# Patient Record
Sex: Female | Born: 1935 | Race: White | Hispanic: No | Smoking: Former smoker
Health system: Southern US, Community
[De-identification: ages and names within clinical notes are randomized; demographics above are authoritative.]

## PROBLEM LIST (undated history)

## (undated) DIAGNOSIS — G459 Transient cerebral ischemic attack, unspecified: Secondary | ICD-10-CM

## (undated) DIAGNOSIS — E785 Hyperlipidemia, unspecified: Secondary | ICD-10-CM

## (undated) DIAGNOSIS — N184 Chronic kidney disease, stage 4 (severe): Secondary | ICD-10-CM

## (undated) DIAGNOSIS — Z9889 Other specified postprocedural states: Secondary | ICD-10-CM

## (undated) DIAGNOSIS — K635 Polyp of colon: Secondary | ICD-10-CM

## (undated) DIAGNOSIS — F0390 Unspecified dementia without behavioral disturbance: Secondary | ICD-10-CM

## (undated) DIAGNOSIS — R001 Bradycardia, unspecified: Secondary | ICD-10-CM

## (undated) DIAGNOSIS — I350 Nonrheumatic aortic (valve) stenosis: Secondary | ICD-10-CM

## (undated) DIAGNOSIS — I10 Essential (primary) hypertension: Secondary | ICD-10-CM

## (undated) DIAGNOSIS — C189 Malignant neoplasm of colon, unspecified: Secondary | ICD-10-CM

## (undated) DIAGNOSIS — Z950 Presence of cardiac pacemaker: Secondary | ICD-10-CM

## (undated) DIAGNOSIS — Z8719 Personal history of other diseases of the digestive system: Secondary | ICD-10-CM

## (undated) HISTORY — DX: Bradycardia, unspecified: R00.1

## (undated) HISTORY — PX: ABDOMINAL HYSTERECTOMY: SHX81

## (undated) HISTORY — DX: Polyp of colon: K63.5

## (undated) HISTORY — PX: COLON RESECTION: SHX5231

## (undated) HISTORY — DX: Malignant neoplasm of colon, unspecified: C18.9

## (undated) HISTORY — DX: Nonrheumatic aortic (valve) stenosis: I35.0

## (undated) HISTORY — DX: Hyperlipidemia, unspecified: E78.5

---

## 1997-09-02 ENCOUNTER — Ambulatory Visit (HOSPITAL_COMMUNITY): Admission: RE | Admit: 1997-09-02 | Discharge: 1997-09-02 | Payer: Self-pay | Admitting: *Deleted

## 1997-10-03 ENCOUNTER — Encounter: Payer: Self-pay | Admitting: Gastroenterology

## 1998-11-10 ENCOUNTER — Ambulatory Visit (HOSPITAL_COMMUNITY): Admission: RE | Admit: 1998-11-10 | Discharge: 1998-11-10 | Payer: Self-pay | Admitting: Internal Medicine

## 1999-01-28 ENCOUNTER — Other Ambulatory Visit: Admission: RE | Admit: 1999-01-28 | Discharge: 1999-01-28 | Payer: Self-pay | Admitting: Gastroenterology

## 1999-01-28 ENCOUNTER — Encounter (INDEPENDENT_AMBULATORY_CARE_PROVIDER_SITE_OTHER): Payer: Self-pay | Admitting: Specialist

## 1999-02-15 ENCOUNTER — Ambulatory Visit (HOSPITAL_COMMUNITY): Admission: RE | Admit: 1999-02-15 | Discharge: 1999-02-15 | Payer: Self-pay | Admitting: Gastroenterology

## 1999-02-15 ENCOUNTER — Encounter: Payer: Self-pay | Admitting: Gastroenterology

## 2000-06-18 ENCOUNTER — Inpatient Hospital Stay (HOSPITAL_COMMUNITY): Admission: EM | Admit: 2000-06-18 | Discharge: 2000-06-29 | Payer: Self-pay | Admitting: *Deleted

## 2000-06-21 ENCOUNTER — Encounter: Payer: Self-pay | Admitting: Family Medicine

## 2000-06-23 ENCOUNTER — Encounter: Payer: Self-pay | Admitting: Family Medicine

## 2000-06-26 ENCOUNTER — Encounter: Payer: Self-pay | Admitting: Family Medicine

## 2000-06-28 ENCOUNTER — Encounter: Payer: Self-pay | Admitting: Cardiovascular Disease

## 2000-07-04 ENCOUNTER — Encounter: Admission: RE | Admit: 2000-07-04 | Discharge: 2000-07-04 | Payer: Self-pay | Admitting: Family Medicine

## 2000-08-30 ENCOUNTER — Encounter: Admission: RE | Admit: 2000-08-30 | Discharge: 2000-08-30 | Payer: Self-pay | Admitting: Family Medicine

## 2000-12-15 ENCOUNTER — Encounter: Admission: RE | Admit: 2000-12-15 | Discharge: 2000-12-15 | Payer: Self-pay | Admitting: Family Medicine

## 2000-12-28 ENCOUNTER — Ambulatory Visit (HOSPITAL_COMMUNITY): Admission: RE | Admit: 2000-12-28 | Discharge: 2000-12-28 | Payer: Self-pay | Admitting: Family Medicine

## 2001-01-10 ENCOUNTER — Encounter: Admission: RE | Admit: 2001-01-10 | Discharge: 2001-01-10 | Payer: Self-pay | Admitting: Family Medicine

## 2001-01-24 ENCOUNTER — Encounter: Admission: RE | Admit: 2001-01-24 | Discharge: 2001-01-24 | Payer: Self-pay | Admitting: Family Medicine

## 2001-04-13 ENCOUNTER — Encounter: Admission: RE | Admit: 2001-04-13 | Discharge: 2001-04-13 | Payer: Self-pay | Admitting: Family Medicine

## 2003-08-21 ENCOUNTER — Encounter: Admission: RE | Admit: 2003-08-21 | Discharge: 2003-08-21 | Payer: Self-pay | Admitting: Internal Medicine

## 2003-08-28 ENCOUNTER — Encounter: Admission: RE | Admit: 2003-08-28 | Discharge: 2003-08-28 | Payer: Self-pay | Admitting: Internal Medicine

## 2003-10-17 ENCOUNTER — Encounter: Admission: RE | Admit: 2003-10-17 | Discharge: 2003-10-17 | Payer: Self-pay | Admitting: Internal Medicine

## 2004-08-20 ENCOUNTER — Encounter: Admission: RE | Admit: 2004-08-20 | Discharge: 2004-08-20 | Payer: Self-pay | Admitting: Internal Medicine

## 2004-10-27 ENCOUNTER — Ambulatory Visit: Payer: Self-pay | Admitting: Gastroenterology

## 2005-01-17 ENCOUNTER — Ambulatory Visit: Payer: Self-pay | Admitting: Gastroenterology

## 2005-01-28 ENCOUNTER — Ambulatory Visit: Payer: Self-pay | Admitting: Gastroenterology

## 2005-01-28 ENCOUNTER — Encounter (INDEPENDENT_AMBULATORY_CARE_PROVIDER_SITE_OTHER): Payer: Self-pay | Admitting: *Deleted

## 2005-05-30 HISTORY — PX: CHOLECYSTECTOMY: SHX55

## 2006-01-20 ENCOUNTER — Encounter: Admission: RE | Admit: 2006-01-20 | Discharge: 2006-01-20 | Payer: Self-pay | Admitting: Internal Medicine

## 2006-02-20 ENCOUNTER — Encounter: Admission: RE | Admit: 2006-02-20 | Discharge: 2006-02-20 | Payer: Self-pay | Admitting: Internal Medicine

## 2006-03-01 ENCOUNTER — Ambulatory Visit: Payer: Self-pay | Admitting: Gastroenterology

## 2006-03-15 ENCOUNTER — Ambulatory Visit: Payer: Self-pay | Admitting: Gastroenterology

## 2006-03-29 ENCOUNTER — Encounter (INDEPENDENT_AMBULATORY_CARE_PROVIDER_SITE_OTHER): Payer: Self-pay | Admitting: Specialist

## 2006-03-29 ENCOUNTER — Encounter: Payer: Self-pay | Admitting: Gastroenterology

## 2006-03-29 ENCOUNTER — Ambulatory Visit (HOSPITAL_COMMUNITY): Admission: RE | Admit: 2006-03-29 | Discharge: 2006-03-30 | Payer: Self-pay | Admitting: General Surgery

## 2006-05-30 HISTORY — PX: VENTRAL HERNIA REPAIR: SHX424

## 2006-12-19 ENCOUNTER — Inpatient Hospital Stay (HOSPITAL_COMMUNITY): Admission: RE | Admit: 2006-12-19 | Discharge: 2006-12-23 | Payer: Self-pay | Admitting: General Surgery

## 2007-08-28 ENCOUNTER — Ambulatory Visit: Payer: Self-pay | Admitting: Gastroenterology

## 2007-08-28 LAB — CONVERTED CEMR LAB: Lipase: 30 units/L (ref 11.0–59.0)

## 2007-09-03 ENCOUNTER — Ambulatory Visit: Payer: Self-pay | Admitting: Gastroenterology

## 2007-09-03 LAB — CONVERTED CEMR LAB
BUN: 23 mg/dL (ref 6–23)
Creatinine, Ser: 1.4 mg/dL — ABNORMAL HIGH (ref 0.4–1.2)

## 2007-09-10 ENCOUNTER — Ambulatory Visit: Payer: Self-pay | Admitting: Cardiology

## 2008-01-18 ENCOUNTER — Ambulatory Visit: Payer: Self-pay | Admitting: Gastroenterology

## 2008-01-25 ENCOUNTER — Encounter: Payer: Self-pay | Admitting: Gastroenterology

## 2008-02-06 ENCOUNTER — Ambulatory Visit: Payer: Self-pay | Admitting: Gastroenterology

## 2008-02-06 ENCOUNTER — Encounter: Payer: Self-pay | Admitting: Gastroenterology

## 2008-02-10 ENCOUNTER — Encounter: Payer: Self-pay | Admitting: Gastroenterology

## 2008-02-21 DIAGNOSIS — Z85038 Personal history of other malignant neoplasm of large intestine: Secondary | ICD-10-CM | POA: Insufficient documentation

## 2008-02-26 ENCOUNTER — Ambulatory Visit: Payer: Self-pay | Admitting: Gastroenterology

## 2008-02-26 DIAGNOSIS — R634 Abnormal weight loss: Secondary | ICD-10-CM

## 2008-02-26 DIAGNOSIS — R1013 Epigastric pain: Secondary | ICD-10-CM

## 2008-02-27 ENCOUNTER — Telehealth: Payer: Self-pay | Admitting: Gastroenterology

## 2008-02-27 ENCOUNTER — Encounter: Payer: Self-pay | Admitting: Gastroenterology

## 2008-02-27 ENCOUNTER — Ambulatory Visit: Payer: Self-pay | Admitting: Gastroenterology

## 2008-02-28 ENCOUNTER — Ambulatory Visit (HOSPITAL_COMMUNITY): Admission: RE | Admit: 2008-02-28 | Discharge: 2008-02-28 | Payer: Self-pay | Admitting: Gastroenterology

## 2008-02-28 ENCOUNTER — Encounter: Payer: Self-pay | Admitting: Gastroenterology

## 2008-02-29 ENCOUNTER — Telehealth: Payer: Self-pay | Admitting: Gastroenterology

## 2008-03-27 ENCOUNTER — Ambulatory Visit: Payer: Self-pay | Admitting: Gastroenterology

## 2008-03-27 DIAGNOSIS — K219 Gastro-esophageal reflux disease without esophagitis: Secondary | ICD-10-CM | POA: Insufficient documentation

## 2008-03-27 DIAGNOSIS — K2970 Gastritis, unspecified, without bleeding: Secondary | ICD-10-CM | POA: Insufficient documentation

## 2008-03-27 DIAGNOSIS — K2990 Gastroduodenitis, unspecified, without bleeding: Secondary | ICD-10-CM

## 2008-03-27 DIAGNOSIS — K3184 Gastroparesis: Secondary | ICD-10-CM | POA: Insufficient documentation

## 2008-04-03 ENCOUNTER — Telehealth (INDEPENDENT_AMBULATORY_CARE_PROVIDER_SITE_OTHER): Payer: Self-pay | Admitting: *Deleted

## 2008-09-01 ENCOUNTER — Ambulatory Visit: Payer: Self-pay | Admitting: Gastroenterology

## 2008-09-02 ENCOUNTER — Encounter: Payer: Self-pay | Admitting: Gastroenterology

## 2009-03-30 HISTORY — PX: BACK SURGERY: SHX140

## 2009-04-16 ENCOUNTER — Inpatient Hospital Stay (HOSPITAL_COMMUNITY): Admission: RE | Admit: 2009-04-16 | Discharge: 2009-04-19 | Payer: Self-pay | Admitting: Cardiovascular Disease

## 2009-04-16 DIAGNOSIS — Z950 Presence of cardiac pacemaker: Secondary | ICD-10-CM

## 2009-04-16 HISTORY — PX: PERMANENT PACEMAKER INSERTION: SHX6023

## 2009-04-16 HISTORY — DX: Presence of cardiac pacemaker: Z95.0

## 2010-06-20 ENCOUNTER — Encounter: Payer: Self-pay | Admitting: Gastroenterology

## 2010-09-01 LAB — COMPREHENSIVE METABOLIC PANEL
AST: 22 U/L (ref 0–37)
Albumin: 3.2 g/dL — ABNORMAL LOW (ref 3.5–5.2)
Alkaline Phosphatase: 33 U/L — ABNORMAL LOW (ref 39–117)
Alkaline Phosphatase: 37 U/L — ABNORMAL LOW (ref 39–117)
Alkaline Phosphatase: 39 U/L (ref 39–117)
BUN: 25 mg/dL — ABNORMAL HIGH (ref 6–23)
BUN: 30 mg/dL — ABNORMAL HIGH (ref 6–23)
BUN: 31 mg/dL — ABNORMAL HIGH (ref 6–23)
CO2: 22 mEq/L (ref 19–32)
CO2: 23 mEq/L (ref 19–32)
CO2: 24 mEq/L (ref 19–32)
Calcium: 8.6 mg/dL (ref 8.4–10.5)
Chloride: 109 mEq/L (ref 96–112)
Chloride: 110 mEq/L (ref 96–112)
Creatinine, Ser: 1.45 mg/dL — ABNORMAL HIGH (ref 0.4–1.2)
Creatinine, Ser: 1.52 mg/dL — ABNORMAL HIGH (ref 0.4–1.2)
GFR calc non Af Amer: 29 mL/min — ABNORMAL LOW (ref >60)
GFR calc non Af Amer: 33 mL/min — ABNORMAL LOW (ref >60)
GFR calc non Af Amer: 35 mL/min — ABNORMAL LOW (ref >60)
Glucose, Bld: 104 mg/dL — ABNORMAL HIGH (ref 70–99)
Glucose, Bld: 122 mg/dL — ABNORMAL HIGH (ref 70–99)
Potassium: 3.9 mEq/L (ref 3.5–5.1)
Potassium: 4 mEq/L (ref 3.5–5.1)
Potassium: 4.1 mEq/L (ref 3.5–5.1)
Total Bilirubin: 0.5 mg/dL (ref 0.3–1.2)
Total Bilirubin: 0.5 mg/dL (ref 0.3–1.2)
Total Protein: 5.9 g/dL — ABNORMAL LOW (ref 6.0–8.3)

## 2010-09-01 LAB — CBC
HCT: 36.3 % (ref 36.0–46.0)
HCT: 36.9 % (ref 36.0–46.0)
HCT: 36.9 % (ref 36.0–46.0)
Hemoglobin: 12.3 g/dL (ref 12.0–15.0)
Hemoglobin: 12.4 g/dL (ref 12.0–15.0)
MCHC: 33.8 g/dL (ref 30.0–36.0)
MCV: 86.1 fL (ref 78.0–100.0)
MCV: 86.5 fL (ref 78.0–100.0)
Platelets: 217 10*3/uL (ref 150–400)
RBC: 4.26 MIL/uL (ref 3.87–5.11)
RDW: 13.7 % (ref 11.5–15.5)
WBC: 8.8 10*3/uL (ref 4.0–10.5)
WBC: 9.7 10*3/uL (ref 4.0–10.5)

## 2010-09-01 LAB — CARDIAC PANEL(CRET KIN+CKTOT+MB+TROPI)
CK, MB: 1.2 ng/mL (ref 0.3–4.0)
CK, MB: 1.6 ng/mL (ref 0.3–4.0)
Relative Index: INVALID (ref 0.0–2.5)
Total CK: 28 U/L (ref 7–177)
Troponin I: 0.15 ng/mL — ABNORMAL HIGH (ref 0.00–0.06)
Troponin I: 0.17 ng/mL — ABNORMAL HIGH (ref 0.00–0.06)
Troponin I: 0.41 ng/mL — ABNORMAL HIGH (ref 0.00–0.06)

## 2010-09-01 LAB — LIPID PANEL
LDL Cholesterol: 101 mg/dL — ABNORMAL HIGH (ref 0–99)
Triglycerides: 140 mg/dL (ref <150)

## 2010-09-01 LAB — URINE CULTURE

## 2010-09-29 ENCOUNTER — Emergency Department (HOSPITAL_COMMUNITY): Payer: Medicare Other

## 2010-09-29 ENCOUNTER — Inpatient Hospital Stay (HOSPITAL_COMMUNITY)
Admission: EM | Admit: 2010-09-29 | Discharge: 2010-10-05 | DRG: 149 | Disposition: A | Discharge status: Home / Self Care | Payer: Medicare Other | Source: Ambulatory Visit | Attending: Family Medicine | Admitting: Family Medicine

## 2010-09-29 DIAGNOSIS — N189 Chronic kidney disease, unspecified: Secondary | ICD-10-CM | POA: Diagnosis present

## 2010-09-29 DIAGNOSIS — Z950 Presence of cardiac pacemaker: Secondary | ICD-10-CM

## 2010-09-29 DIAGNOSIS — K219 Gastro-esophageal reflux disease without esophagitis: Secondary | ICD-10-CM | POA: Diagnosis present

## 2010-09-29 DIAGNOSIS — Z79899 Other long term (current) drug therapy: Secondary | ICD-10-CM

## 2010-09-29 DIAGNOSIS — Z880 Allergy status to penicillin: Secondary | ICD-10-CM

## 2010-09-29 DIAGNOSIS — H8110 Benign paroxysmal vertigo, unspecified ear: Principal | ICD-10-CM | POA: Diagnosis present

## 2010-09-29 DIAGNOSIS — Z7982 Long term (current) use of aspirin: Secondary | ICD-10-CM

## 2010-09-29 DIAGNOSIS — F329 Major depressive disorder, single episode, unspecified: Secondary | ICD-10-CM | POA: Diagnosis present

## 2010-09-29 DIAGNOSIS — I129 Hypertensive chronic kidney disease with stage 1 through stage 4 chronic kidney disease, or unspecified chronic kidney disease: Secondary | ICD-10-CM | POA: Diagnosis present

## 2010-09-29 DIAGNOSIS — N390 Urinary tract infection, site not specified: Secondary | ICD-10-CM | POA: Diagnosis present

## 2010-09-29 DIAGNOSIS — N179 Acute kidney failure, unspecified: Secondary | ICD-10-CM | POA: Diagnosis present

## 2010-09-29 DIAGNOSIS — A498 Other bacterial infections of unspecified site: Secondary | ICD-10-CM | POA: Diagnosis present

## 2010-09-29 DIAGNOSIS — F3289 Other specified depressive episodes: Secondary | ICD-10-CM | POA: Diagnosis present

## 2010-09-29 DIAGNOSIS — E785 Hyperlipidemia, unspecified: Secondary | ICD-10-CM | POA: Diagnosis present

## 2010-09-29 DIAGNOSIS — F0390 Unspecified dementia without behavioral disturbance: Secondary | ICD-10-CM | POA: Diagnosis present

## 2010-09-29 HISTORY — DX: Unspecified dementia, unspecified severity, without behavioral disturbance, psychotic disturbance, mood disturbance, and anxiety: F03.90

## 2010-09-29 HISTORY — DX: Other specified postprocedural states: Z98.890

## 2010-09-29 HISTORY — DX: Personal history of other diseases of the digestive system: Z87.19

## 2010-09-29 LAB — CBC
HCT: 44 % (ref 36.0–46.0)
Hemoglobin: 14 g/dL (ref 12.0–15.0)
MCH: 27.9 pg (ref 26.0–34.0)
MCV: 87.6 fL (ref 78.0–100.0)
Platelets: 346 10*3/uL (ref 150–400)
RBC: 5.02 MIL/uL (ref 3.87–5.11)
WBC: 16.8 10*3/uL — ABNORMAL HIGH (ref 4.0–10.5)

## 2010-09-29 LAB — BASIC METABOLIC PANEL
Chloride: 104 mEq/L (ref 96–112)
GFR calc Af Amer: 29 mL/min — ABNORMAL LOW (ref >60)
GFR calc non Af Amer: 24 mL/min — ABNORMAL LOW (ref >60)
Potassium: 4.5 mEq/L (ref 3.5–5.1)
Sodium: 136 mEq/L (ref 135–145)

## 2010-09-29 LAB — URINALYSIS, ROUTINE W REFLEX MICROSCOPIC
Glucose, UA: NEGATIVE mg/dL
Ketones, ur: NEGATIVE mg/dL
Nitrite: NEGATIVE
Specific Gravity, Urine: 1.02 (ref 1.005–1.030)
pH: 5.5 (ref 5.0–8.0)

## 2010-09-29 LAB — DIFFERENTIAL
Eosinophils Absolute: 0.1 10*3/uL (ref 0.0–0.7)
Lymphocytes Relative: 10 % — ABNORMAL LOW (ref 12–46)
Lymphs Abs: 1.7 10*3/uL (ref 0.7–4.0)
Monocytes Relative: 4 % (ref 3–12)
Neutrophils Relative %: 84 % — ABNORMAL HIGH (ref 43–77)

## 2010-09-29 LAB — URINE MICROSCOPIC-ADD ON

## 2010-09-29 LAB — POCT CARDIAC MARKERS
CKMB, poc: <1 ng/mL — ABNORMAL LOW (ref 1.0–8.0)
Troponin i, poc: <0.05 ng/mL (ref 0.00–0.09)

## 2010-09-30 ENCOUNTER — Observation Stay (HOSPITAL_COMMUNITY): Payer: Medicare Other

## 2010-10-01 ENCOUNTER — Encounter (HOSPITAL_COMMUNITY): Payer: Self-pay | Admitting: Radiology

## 2010-10-01 ENCOUNTER — Observation Stay (HOSPITAL_COMMUNITY): Payer: Medicare Other

## 2010-10-01 LAB — CBC
Hemoglobin: 11.2 g/dL — ABNORMAL LOW (ref 12.0–15.0)
MCH: 26.9 pg (ref 26.0–34.0)
MCHC: 30.2 g/dL (ref 30.0–36.0)
Platelets: 256 10*3/uL (ref 150–400)
RBC: 4.17 MIL/uL (ref 3.87–5.11)

## 2010-10-01 LAB — COMPREHENSIVE METABOLIC PANEL
ALT: 9 U/L (ref 0–35)
AST: 20 U/L (ref 0–37)
Albumin: 2.7 g/dL — ABNORMAL LOW (ref 3.5–5.2)
Calcium: 8.4 mg/dL (ref 8.4–10.5)
Chloride: 112 mEq/L (ref 96–112)
Creatinine, Ser: 1.9 mg/dL — ABNORMAL HIGH (ref 0.4–1.2)
GFR calc Af Amer: 31 mL/min — ABNORMAL LOW (ref >60)
Sodium: 140 mEq/L (ref 135–145)

## 2010-10-01 LAB — URINE CULTURE

## 2010-10-02 LAB — LIPASE, BLOOD: Lipase: 52 U/L (ref 11–59)

## 2010-10-02 LAB — COMPREHENSIVE METABOLIC PANEL
ALT: 13 U/L (ref 0–35)
BUN: 23 mg/dL (ref 6–23)
CO2: 22 mEq/L (ref 19–32)
Calcium: 8.2 mg/dL — ABNORMAL LOW (ref 8.4–10.5)
Creatinine, Ser: 1.72 mg/dL — ABNORMAL HIGH (ref 0.4–1.2)
GFR calc non Af Amer: 29 mL/min — ABNORMAL LOW (ref >60)
Glucose, Bld: 79 mg/dL (ref 70–99)

## 2010-10-02 LAB — CBC
HCT: 36.7 % (ref 36.0–46.0)
Hemoglobin: 11 g/dL — ABNORMAL LOW (ref 12.0–15.0)
MCH: 26.9 pg (ref 26.0–34.0)
MCHC: 30 g/dL (ref 30.0–36.0)
RDW: 13.6 % (ref 11.5–15.5)

## 2010-10-02 LAB — AMYLASE: Amylase: 39 U/L (ref 0–105)

## 2010-10-03 LAB — BASIC METABOLIC PANEL
BUN: 23 mg/dL (ref 6–23)
CO2: 21 mEq/L (ref 19–32)
Chloride: 113 mEq/L — ABNORMAL HIGH (ref 96–112)
Creatinine, Ser: 1.99 mg/dL — ABNORMAL HIGH (ref 0.4–1.2)
Potassium: 4.2 mEq/L (ref 3.5–5.1)

## 2010-10-04 LAB — BASIC METABOLIC PANEL
CO2: 20 mEq/L (ref 19–32)
Calcium: 8.4 mg/dL (ref 8.4–10.5)
Chloride: 112 mEq/L (ref 96–112)
Creatinine, Ser: 1.94 mg/dL — ABNORMAL HIGH (ref 0.4–1.2)
GFR calc Af Amer: 30 mL/min — ABNORMAL LOW (ref >60)
Glucose, Bld: 77 mg/dL (ref 70–99)

## 2010-10-05 NOTE — Discharge Summary (Signed)
NAMEJOPLIN, Erica Stanton              ACCOUNT NO.:  [id number]  MEDICAL RECORD NO.:  [id number]           PATIENT TYPE:  I  LOCATION:  5522                         FACILITY:  [facility]  PHYSICIAN:  Standley Dakins, MD   DATE OF BIRTH:  December 19, 1927  DATE OF ADMISSION:  09/29/2010 DATE OF DISCHARGE:  10/05/2010                            DISCHARGE SUMMARY   DISCHARGE DIAGNOSES: 1. Hypertension -- difficult to control. 2. Benign positional vertigo. 3. Hyperlipidemia. 4. Acute-on-chronic renal insufficiency -- back at baseline. 5. Dementia. 6. Urinary tract infection -- E-coli sensitive to Levaquin.  DISCHARGE MEDICATIONS: 1. Amlodipine 10 mg one p.o. daily. 2. Aspirin 81 mg one p.o. daily. 3. Hydralazine 10 mg one p.o. 4 times daily. 4. Levofloxacin 250 mg one p.o. daily. 5. Amitriptyline 10 mg 2 p.o. at bedtime. 6. Aricept 10 mg one p.o. daily. 7. Lanoxin 0.125 mg one p.o. daily. 8. Losartan 100 mg one p.o. daily. 9. Meclizine 25 mg one p.o. q.8 h. p.r.n. dizziness. 10.Metoclopramide 10 mg one p.o. 3 times a day before meals. 11.Metoprolol 50 mg one p.o. twice daily. 12.Simvastatin 20 mg one p.o. daily at bedtime. 13.TriCor 48 mg one p.o. daily. 14.Florist 1 capsule by mouth twice daily for 7 days then discontinue.  HOSPITAL COURSE:  Briefly, this patient is a very pleasant 77 year old female with hypertension, dementia, and some depression, status post pacemaker placement for arrhythmia, who presented to the emergency department with dizziness and found to have urinary tract infection and dehydration and acute-on-chronic renal insufficiency.  She was admitted into the hospital and started on IV fluids and IV antibiotics and tolerated that very well.  She also had a problem of vertigo that was difficult to control and also she had hypertension that was difficult to control.  She was started on several new medications in addition to her home medications and over several days her  blood pressures improved.    She worked with the inpatient physical therapy team and the partners for vertigo treatment, and she was diagnosed with benign positional vertigo, and she had several treatments, and she seemed to have significant improvement in her vertigo, but she is going to need outpatient rehabilitation at the neuro rehab center for vestibular treatments.  Her kidney function improved to her baseline after gentle IV fluids.  The patient was discharged home to have outpatient rehabilitation with the vestibular rehab at the [facility].  DISCHARGE CONDITION:  Stable at baseline.  DISPOSITION:  The patient will be discharged home.  She will have outpatient treatments at the neuro rehab center as mentioned above.  ACTIVITY:  As tolerated.  Fall precautions recommended.  Also a walker recommended so that she can have better gait stability.  DIET:  Low sodium cardiac prudent diet recommended.  FOLLOWUP:  With her primary care provider, Dr. Roberto Scales in 1 week for a blood pressure recheck.  Also follow up with the outpatient neuro rehab center.  They will call her and arrange an appointment for her to follow up after she is discharged home.  The care coordinators have already started arranging for her to have an appointment with them.  SPECIAL INSTRUCTIONS:  Return if symptoms recur, worsen, or new changes develop.  The patient was given these instructions and verbalized understanding.  I will like for the patient to have her blood pressure rechecked with her primary care provider in 1 week.  The patient is going to finish one additional dose of antibiotics for the urinary tract infection and then she will be completely finished of the course of antibiotics for this infection.  On the day of discharge, the patient is awake and alert, and she is not having any dizziness.  She worked with the physical therapist this morning, and they have cleared her for  discharge and also recommended the outpatient rehab. GENERAL:  The patient is very pleasant, eating, and keeping down her food, drinking with no problems.  Patient is awake and alert and oriented. VITAL SIGNS:  Her temperature on the day of discharge 98.0, pulse 63, respirations 22, blood pressure 114/71. LUNGS:  Clear bilaterally with no crackles or rhonchi heard. CARDIAC:  Normal S1, S2 sounds heard with no murmurs. ABDOMEN:  Soft, nondistended, nontender with bowel sounds present. EXTREMITIES:  No pretibial edema or cyanosis. NEUROLOGICAL:  The patient has a steady gait today and no focal deficits.  I spent 37 minutes preparing this patient's discharge, reviewing her medical records, arranging outpatient followup, dictating this summary, and providing prescriptions.     Standley Dakins, MD     CJ/MEDQ  D:  10/05/2010  T:  10/05/2010  Job:  [id number]  Electronically Signed by Standley Dakins  on 10/05/2010 05:51:44 PM

## 2010-10-11 ENCOUNTER — Ambulatory Visit: Payer: Medicare Other | Admitting: Rehabilitative and Restorative Service Providers"

## 2010-10-12 NOTE — H&P (Signed)
NAMEELYANAH, FARINO              ACCOUNT NO.:  [id number]   MEDICAL RECORD NO.:  [id number]          PATIENT TYPE:  INP   LOCATION:  NA                           FACILITY:  [facility]   PHYSICIAN:  Anselm Pancoast. Weatherly, M.D.DATE OF BIRTH:  April 04, 1928   DATE OF ADMISSION:  DATE OF DISCHARGE:                              HISTORY & PHYSICAL   CHIEF COMPLAINT:  Ventral incisional hernia.   HISTORY:  Erica Stanton is a 77 year old Caucasian female who I saw her  and did a laparoscopic cholecystectomy on approximately a year ago.  She  had a previous sigmoid colectomy by Dr. Paulla Fore several years  earlier and had kind of a minimal weakness in the lower abdomen, but we  were able to do her laparoscopically after kind of entering at the  umbilicus very carefully and then freeing up some adhesions in the right  lower quadrant so we could kind of go around and do the laparoscopic  with the usual port placements.  Postoperatively, about three months  after I saw her, she presented with a little weakness to the right and  below the umbilicus, back in the area where she had had her sigmoid  colectomy by Dr. Samuella Cota, and at that time, it was not all that prominent,  and by basically wearing a girdle, she appeared to be moderately  asymptomatic but then over the last several months the area has  increased in size.  We got a CT of the abdomen in September 2007 when  she first started complaining of the weakness, and it showed a small  midline hernia containing only fat below the umbilicus.  I have seen her  back more recently, and the hernia is definitely increasing in size, and  she desires to have it surgically corrected.   Her past medical history is significant in that has got hypertension.  She has also got a pacemaker.  She has had a recent colon exam by Dr.  Russella Dar that did not show any abnormalities, and she is here today for the  planned procedure.  She has had a mag citrate bowel prep so  that  hopefully she will not be constipated, and she did not take her blood  pressure medications this morning.   PAST MEDICAL HISTORY:  Her cardiac is followed by Alanda Amass and  associates, and there has been no change in her blood pressure  medications.  Her weight was about 182 pounds a year ago.  She weighs 75-  1/2 kg a day.   PAST MEDICAL HISTORY:  She has had a hysterectomy abdominal years ago by  Dr. Katharine Look.  Dr price did a sigmoid colectomy approximately 10 or  11 years ago.   CHRONIC MEDICATIONS:  1. She is on Cozaar 50 mg daily.  2. Cartia XT 240 mcg daily.  3. TriCor 48 mg daily.  4. Aciphex 20 mg in the morning.  5. Meclizine 25 mg at bedtime p.r.n.  6. Amitriptyline 10 mg 2 at bedtime p.r.n.  7. Baby aspirin a day.  8. Temazepam 15 mg at bedtime.  9. Clarinex p.r.n.  10.She takes Vicodin q.6h. p.r.n. for vague musculoskeletal abdominal      pain.   On physical exam today, she is an elderly female in no acute distress  and was oriented to time, place and person.  Her temperature was 97.9,  pulse was 84 and regular, respirations 16, blood pressure is 160/80.  O2  saturations are 95%.  She is 5 feet 4 inches in height.  EYES, EARS, NOSE AND THROAT:  Adequately hydrated.  She does have  dentures.  No cervical or supraclavicular lymphadenopathy.  LUNGS:  Clear.  CARDIAC:  Normal sinus rhythm.  She does have a pacemaker.  ABDOMEN:  With straining, there is a kind of maybe a lemon mass to the  right of a midline incision kind of halfway between the umbilicus and  the symphysis pubis.  I do not appreciate a definite weakness at the  naval area where she has had the previous laparoscopic cholecystectomy.  I did not do a rectal or pelvic examination this morning.  SKIN:  Is unremarkable.  CENTRAL NERVOUS SYSTEM:  Physiologic.   ADMISSION IMPRESSION:  1. Incisional hernia lower midline from previous sigmoid colectomy.  2. History of hypertension.  3. She has got a  cardiac pacemaker.  Denies angina.   PLAN:  The patient will have a repair of this incisional hernia.  Hopefully, we will be able to put mesh intra-abdominally but actually  stay extraperitoneally, I hope.           ______________________________  Anselm Pancoast. Zachery Dakins, M.D.     WJW/MEDQ  D:  12/19/2006  T:  12/19/2006  Job:  [id number]   cc:   Anselm Pancoast. Zachery Dakins, M.D.  [address]  [place]  [place] [address]

## 2010-10-12 NOTE — Op Note (Signed)
NAMEMERIS, REEDE              ACCOUNT NO.:  [id number]   MEDICAL RECORD NO.:  [id number]          PATIENT TYPE:  INP   LOCATION:  NA                           [facility]ACILITY:  [facility]   PHYSICIAN:  Anselm Pancoast. Weatherly, M.D.DATE OF BIRTH:  12-Apr-1928   DATE OF PROCEDURE:  12/19/2006  DATE OF DISCHARGE:                               OPERATIVE REPORT   PREOPERATIVE DIAGNOSIS:  Incisional hernia.   POSTOP DIAGNOSIS:  Incisional hernia.   OPERATION:  Incisional hernia with Prolene mesh.   ANESTHESIA:  General.   SURGEON:  Anselm Pancoast. Zachery Dakins, M.D.   ASSISTANT:  Alfonse Ras, MD.   HISTORY:  Erica Stanton is a 77 year old female who I did a laparoscopic  cholecystectomy on approximately, nearly a year ago.  She had had a  previous sigmoid colectomy by Dr. Paulla Fore approximately 10 years  prior to that; and we were able to the laparoscopic hernia through an  incision at the umbilicus.  There was a bunch of adhesions in the right  lower quadrant; and probably about 2 months or maybe 3 months after  surgery.  She was complaining of, kind of a bulge into the lower abdomen  when she was seen in followup for laparoscopic hernia.   The area was not that symptomatic; and over the past 8 or 9 months it  has gradually increased in size, so that she now has kind of a lemon-  size defect that accrues when she stands and strains; and I recommended  that we repair this with mesh reinforcement.  There is not a definite  defect that I can appreciate right at the umbilicus, but there is  definitely a defect in the lower midline.  It is to the right of the  midline incision, but I am sure that it is at the fascia level.  The  patient is a cardiac patient on hypertensive medication; and has a  pacemaker and the pacemaker bag will be placed over it before the  surgery.   The patient had a mag-citrate bowel prep, yesterday, in preparation for  surgery which she tolerated uneventfully.  Preop  she was given a gram of  Ancef.  She has PAS stockings, and taken back to the operative suite;  and a Foley catheter inserted after induction of general anesthesia.  The abdomen was prepped with Betadine surgical scrub and solution, and  draped in a sterile manner.   I had marked the area where the defect is and made a midline incision;  and then dissected down into this.  We very promptly identified the  actual weakness and the actual hernia sac and dissected within this.  I  was able to actually stay extraperitoneally, reducing the intestines and  all; and then freed up circumferentially the posterior peritoneum; and  it was never entered, but I was below the lateral edge of the rectus,  inferiorly.  I was able to free it up circumferentially, divided the  adhesions; and everything that was a questionable blood vessel was  divided and tied with 2-0 Vicryl.  So I could place a piece  of 3 x 6  inch Prolene mesh under the abdominal wall, but above the peritoneum.  Care was taken to actually drop down all the peritoneum etcetera at the  umbilicus, so that I could go up above the umbilicus, even though there  was not an obvious weakness here clinically.  I expected that show how  or another laparoscopic surgery can weaken the previous sigmoid  colectomy adhesions.   The mesh was then sutured through the abdominal wall with interrupted  sutures of #0 Prolene so that the mesh is laying out flat and not under  excessive tension.  Then were about 10 sutures all total used in all  four corners; and then stitches about every 2 inches anchoring up to the  abdominal wall; and then after all of these had been tied, and count was  correct; I then closed the midline also with #0 Prolene incorporating  the stitch in the center of the mesh to anchor everything securely.   The subcutaneous tissue was then closed with interrupted sutures of 2-0  Vicryl; and then the skin, itself, closed with staples.   An abdominal  binder was placed after a sterile dressing was placed.  I am going to  basically just give her sips of liquids, today; and her blood pressure  medications.  Then, if she is not nauseous, we will resume a liquid  diet; and whether she will be here one or two nights will be determined  according to her postoperative ileus.           ______________________________  Anselm Pancoast. Zachery Dakins, M.D.     WJW/MEDQ  D:  12/19/2006  T:  12/19/2006  Job:  [id number]

## 2010-10-12 NOTE — Assessment & Plan Note (Signed)
[facility]                         GASTROENTEROLOGY OFFICE NOTE   MAYSON, STERBENZ                     MRN:          [id number]  DATE:08/28/2007                            DOB:          12-Sep-1927    Mrs. Erica Stanton complains of worsening lower abdominal pain for the past 3  to 4 months.  She does not note any alleviating  factors but her  symptoms worsen with meals.  She has not noted any change in bowel  habits, rectal bleeding, or change in stool caliber.  She notes a weight  loss of about 25 pounds over the past 2 to 3 months with a substantial  decrease in appetite.  She was recently evaluated by Dr. Susa Griffins, and a CBC, TSH, CEA, erythrocyte sedimentation rate, and  comprehensive metabolic panel were all unremarkable, except for an  elevated BUN of 30 and elevated creatinine of 1.8.  She underwent an  abdominal ultrasound with Doppler, which showed a patent celiac artery,  a SMA narrowing of greater than 50% and a normal IMA.  Since I last saw  her, she underwent repair of incisional hernia with proline mesh on December 19, 2006 and a laparoscopic cholecystectomy on March 29, 2006.   CURRENT MEDICATIONS:  Listed on the chart, updated, and reviewed.   MEDICATION ALLERGIES:  PENICILLIN.   EXAM:  In no acute distress.  Blood pressure 140/56, pulse 64 and regular, weight 135.2 pounds.  Her  weight in October 2007 was 182.  HEENT:  Anicteric sclerae, oropharynx clear.  CHEST:  Clear to auscultation bilaterally.  CARDIAC:  Regular rate and rhythm without murmurs.  ABDOMEN:  Soft, nondistended, normoactive bowel sounds.  Mild lower  abdominal tenderness to deep palpation.  No rebound or guarding.  No  palpable organomegaly, masses, or hernias.   ASSESSMENT AND PLAN:  Lower abdominal pain exacerbated by meals with  anorexia and weight loss.  Prior history of colon cancer.  Recent  abdominal ultrasound with Dopplers revealed a  significant SMA stenosis.  The other 2 mesenteric vessels appeared to be patent.  Rule out chronic  partial bowel obstruction, recurrent colorectal cancer, mesenteric  ischemia and other disorders.  Schedule a CT scan of the abdomen and  pelvis.  If her renal function allows, it will be a contrasted study  with a CT angiogram.  In the interim, she was advised to push oral  fluids.  Continue a low-fat diet.  Consider colonoscopy at her return  office visit in 2 weeks.     Venita Lick. Russella Dar, MD, [facility]  Electronically Signed    MTS/MedQ  DD: 08/30/2007  DT: 08/30/2007  Job #: [id number]   cc:   Gerlene Burdock A. Alanda Amass, M.D.

## 2010-10-12 NOTE — Discharge Summary (Signed)
Erica Stanton, Erica Stanton NO.:  [id number]   MEDICAL RECORD NO.:  [id number]          PATIENT TYPE:  INP   LOCATION:  1415                         [facility]ACILITY:  [facility]   PHYSICIAN:  Anselm Pancoast. Weatherly, M.D.DATE OF BIRTH:  1927/08/16   DATE OF ADMISSION:  12/19/2006  DATE OF DISCHARGE:  12/23/2006                               DISCHARGE SUMMARY   DISCHARGE DIAGNOSES:  1. Incisional ventral hernia.  2. History of cardiac arrhythmias.  3. History of hypertension.   OPERATION:  Open ventral hernia repair with mesh placed preperitoneally.   Postoperatively, she did have a short run of atrial fibrillation.   CONSULTATION:  She was seen by Dr. Ladona Ridgel with [facility].   HISTORY:  Erica Stanton is a 77 year old Caucasian female who I saw  approximately a year ago when she had problems with gallstones.  She had  had a previous sigmoid colectomy Dr. Samuella Cota approximately 10 years ago  and had kind of a diffuse weakness of the lower abdomen but not really a  symptomatic hernia.  At the time of her laparoscopic repair we were able  to get into the abdomen at the umbilicus and continued the  cholecystectomy and cholangiogram successfully.  Several months later,  however, she complained of kind of a bulge down below the navel to the  right and over the next few months she has progressed with a small  ventral hernia that has increased in size.  She is followed by Dr.  Alanda Amass and associates for her blood pressure and cardiac care and she  is on multiple medications.  She is on Cozaar 50 mcg daily, __________  XL 240 mg, TriCor, Aciphex, Meclizine at bedtime and amitriptyline at  bedtime.  She also takes Vicodin p.r.n. for a kind of vague  musculoskeletal pain.  She has a definite bulge that has protruded,  getting larger down into the right upper midline incision and I  recommended that we repair this with mesh placed in the preperitoneal  space and she was admitted  on Monday for the planned surgery.  She was  taken to surgery and Dr. Colin Benton assisted and the patient had a definite  bulge where the incision had given way down in the lower midline.  This  was opened up and a piece of Prolene mesh was placed in the  preperitoneal space.  We were never actually on the surface of the colon  and then we closed the incision over this with Prolene sutures.  Postoperatively in the recovery room she appeared to become a little  over-medicated with the routine pain protocol and we put her in the Step  Down.  She did satisfactory, was never nauseous, never needed a  nasogastric tube, started on a liquid diet the day after surgery and on  about the second postoperative day she did have a run of atrial  fibrillation that spontaneously converted.  She was seen by Dr. Ladona Ridgel  who has placed on her Lanoxin 0.125 mg, which has been called in, in  addition to her usual blood pressure and cardiac medications.  Her  incision  appears to be healing nicely and the staples are in place.  She  was given antibiotics preoperatively, but not continued, and she is  passing gas and having bowel movement at this time.  She is ready for  discharge in improved condition.  She takes  Vicodin for pain, which she will continue, and I have instructed her to  wear an abdominal binder for about the next 3-4 weeks.  For the first 2  weeks I would like her to wear it at all times, then after about 2 weeks  she can just put it on in the morning.  I will see her in the office for  a wound check and staple removal mid next week.           ______________________________  Anselm Pancoast. Zachery Dakins, M.D.     WJW/MEDQ  D:  12/23/2006  T:  12/24/2006  Job:  [id number]   cc:   Gerlene Burdock A. Alanda Amass, M.D.  Fax: [phone]   Patient Chart

## 2010-10-12 NOTE — H&P (Signed)
NAMEKENIDI, Erica Stanton NO.:  [id number]  MEDICAL RECORD NO.:  [id number]           PATIENT TYPE:  E  LOCATION:  [facility]                         FACILITY:  [facility]  PHYSICIAN:  Houston Siren, MD           DATE OF BIRTH:  01/02/1928  DATE OF ADMISSION:  09/29/2010 DATE OF DISCHARGE:                             HISTORY & PHYSICAL   PRIMARY CARE PHYSICIAN:  None.  CARDIOLOGIST:  Ritta Slot, MD  REASON FOR ADMISSION:  Vertigo.  ADVANCE DIRECTIVE:  Full code.  HISTORY OF PRESENT ILLNESS:  This is an 77 year old female with history of hypertension, dementia, depression, status post pacemaker placement for arrhythmia, mild aortic stenosis, hypertension, presented to the emergency room today feeling vertiginous.  She denied any chest pain, shortness of breath, or palpitation.  She has no headache or visual problem.  She denied any recent upper respiratory tract infection symptoms.  There has been no reported slurred speech or local localized weakness.  She was treated with Antivert without relief.  Workup in the emergency room included an EKG which shows paced atrial rhythm.  Head CT was negative and her urinalysis showed the presence of 21-50 wbcs and many bacteria.  Her chest x-ray was clear except for cardiomegaly.  She does have elevated BUN, creatinine of 33 and 2.03.  Her cardiac markers were negative.  She does have an elevated white count of 16.8 thousand and a hemoglobin of 14.0.  Hospitalist was asked to admit the patient for symptomatic treatment of her peripheral vertigo and to treat her urinary tract infection.  PAST MEDICAL HISTORY:  Status post pacemaker placement, status post hysterectomy, sigmoid colectomy, dementia, hypertension, depression, prior vertigo.  ALLERGIES:  Allergy to PENICILLIN.  FAMILY HISTORY:  Noncontributory.  SOCIAL HISTORY:  She denied tobacco, alcohol, or drug use.  CURRENT MEDICATIONS: 1. Antivert. 2. Lopressor 50 mg  b.i.d. 3. Reglan. 4. Simvastatin 20 mg nightly. 5. Elavil 10 mg nightly. 6. Cozaar 100 mg per day. 7. Lanoxin 0.125 mg per day. 8. TriCor. 9. Aricept 10 mg per day.  REVIEW OF SYSTEMS:  Otherwise unremarkable.  PHYSICAL EXAMINATION:  VITAL SIGNS:  Blood pressure 160/90, pulse of 62, respiratory rate 15, temperature 98.1. GENERAL:  She is awake, alert, and conversing.  She has fluent speech with facial symmetry. HEENT:  Tongue is midline.  Uvula elevated with phonation.  Sclerae nonicteric.  She has no nystagmus. NECK:  Supple.  No carotid bruits. CARDIAC:  S1 and S2 regular.  She has a pacemaker on her right upper chest. LUNGS:  Clear with no wheezes, rales, or any evidence of consolidation. ABDOMEN:  Slightly obese, nondistended, and nontender.  No palpable mass.  I did not hear any abdominal bruit, nontender.  Bowel sounds present. EXTREMITIES:  No edema.  Strength equal bilaterally.  Finger-to-nose and heel-to-shin without any problems.  Strength equal bilaterally. Babinski flexion and deep tendon reflexes are normal.  OBJECTIVE FINDINGS:  CT scan of her head is negative.  Urinalysis shows 21-50 wbc with many bacteria, creatinine of 2.03, BUN of 33, potassium 4.5.  Serum sodium 136, troponin less than  0.05.  Chest x-ray is clear.  IMPRESSION:  This is an 77 year old with peripheral vertigo and urinary tract infection.  We will admit her for symptomatic treatment.  She has no cerebellar problem by exam.  I will treat her with low dose meclezine along with Ativan.  She has a urinary tract infection, and she will receive Rocephin.  I note that she is on digoxin and we will check her level. As soon as her medications are reconciled, we will continue them all. She is a full code, and we will refer her for observation under [facility] Team 6.  She is stable.     Houston Siren, MD     PL/MEDQ  D:  09/30/2010  T:  09/30/2010  Job:  [id number]  cc:   Ritta Slot,  MD  Electronically Signed by Houston Siren  on 10/12/2010 12:50:18 AM

## 2010-10-15 NOTE — Assessment & Plan Note (Signed)
[facility]                           GASTROENTEROLOGY OFFICE NOTE   Erica Stanton, Erica Stanton                     MRN:          [id number]  DATE:03/15/2006                            DOB:          1927-11-09    Erica Stanton returns for persistent abdominal pain.  She now localizes her  symptoms to the epigastric area, right upper quadrant and right mid abdomen.  She states her pain is there most of the time but it is clearly exacerbated  by meals, which also lead to significant nausea.  Cholelithiasis was  confirmed on an abdominal ultrasound exam dated January 20, 2006.  CBC, CMET,  and lipase from her office visit on October 12 were unremarkable except for  a mildly elevated BUN and creatinine at 26 and 1.8 respectively.  Liver  function tests and lipase were normal.  She has no fevers, chills, vomiting,  back pain, or change in bowel habits.   CURRENT MEDICATIONS:  Listed on the chart updated and reviewed.   MEDICATION ALLERGIES:  PENICILLIN.   EXAMINATION:  GENERAL:  No acute distress.  VITAL SIGNS:  Weight 182 pounds, blood pressure is 132/70, pulse 72 and  regular.  CHEST:  Clear to auscultation bilaterally.  CARDIAC:  Regular rate and rhythm without murmurs appreciated.  ABDOMEN:  Soft with mild right-sided abdominal tenderness to deep palpation.  No rebound or guarding, no palpable organomegaly, masses, or hernias.  Normal active bowel sounds.   ASSESSMENT AND PLAN:  Right-sided abdominal pain and cholelithiasis.  I  suspect her symptoms are due to cholelithiasis.  Proceed with surgical  consultation with Dr. Zachery Dakins.  Her cardiologist is Dr. Alanda Amass and he  evaluated her on August 15.       Erica Stanton. Erica Dar, MD, [facility]      MTS/MedQ  DD:  03/17/2006  DT:  03/19/2006  Job #:  [id number]   cc:   Olene Craven, M.D.  Anselm Pancoast. Zachery Dakins, M.D.

## 2010-10-15 NOTE — Discharge Summary (Signed)
[place]. [facility]  Patient:    Erica Stanton, Erica Stanton                     MRN: [id number] Adm. Date:  [id number] Disc. Date: [id number] Attending:  McDiarmid, Leighton Roach. Dictator:   Michell Heinrich, M.D. CC:         Arvella Merles, M.D.  Elvina Sidle, M.D.   Discharge Summary  ADMISSION DIAGNOSES: 1. Syncope. 2. Hypertension. 3. Hypoglycemia. 4. Hyperkalemia. 5. Benign positional vertigo.  DISCHARGE DIAGNOSES: 1. Syncope. 2. Hypertension. 3. Hypoglycemia. 4. Hyperkalemia. 5. Sick sinus syndrome (tachycardic-bradycardic syndrome). 6. Benign positional vertigo.  DISCHARGE MEDICATIONS: 1. Cardizem CD 180 mg 1 tablet by mouth daily. 2. Meclizine 25 mg 1 tablet by mouth daily. 3. Aspirin 81 mg 1 tablet by mouth daily.  CONSULTATIONS: 1. Cardiology.  [facility] evaluated the patient and    recommended carotid Dopplers and echocardiogram.  When the carotid Dopplers    revealed no significant stenosis and the echocardiogram did not reveal any    mechanical abnormalities, they were suspicious still for    tachycardic-bradycardic syndrome.  The patient did continue to show periods    of sinus bradycardia and also periods of atrial fibrillation with rapid    ventricular response.  She was rate controlled with Cardizem,    anticoagulated with heparin, and had a Cardiolite to rule out ischemic    etiology.  The Cardiolite showed no signs of ischemia, and the patient had    a pacemaker implanted on June 27, 2000.  It was recommended that she    continue with Cardizem for rate control and follow up with Dr. Alanda Amass    for any possible further adjustments to her pacer. 2. Neurology.  The patient did have a diagnosis benign positional vertigo, and    some of her symptoms were hard to distinguish from this.  Dr. Sandria Manly was    consulted, and he was concerned about possible acoustic neuroma.  An MRI    was obtained and showed no acute  ischemic changes, no pathological    intracranial enhancement, no signs of cerebellar pathology.  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  These results were unremarkable,    and Dr. Alena Bills further recommendations were to have the patient follow up    with him for possible modified ______ maneuver as an outpatient for    treatment of benign positional vertigo.  PROCEDURES: 1. Carotid Dopplers showed no significant ICA stenosis bilaterally, with    antegrade vertebral artery flow bilaterally. 2. Persantine Cardiolite June 26, 2000, was negative for ischemia, ejection    fraction was 68%. 3. MRI and MRA of brain, results as dictated in the consult section. 4. Pacemaker insertion on June 27, 2000.  HISTORY OF PRESENT ILLNESS:  For complete history and physical please see resident H&P in chart.  Briefly, this is a 77 year old white female with a history of hypertension who was admitted with syncopal event.  HOSPITAL COURSE: #1 - SYNCOPE:  The patient was found to be hypoglycemic and hyperkalemic on presentation.  She was also slightly bradycardic.  Once the patient was admitted to the hospital she did not have any further episodes of syncope. She did have occasional dizziness which she stated had been normal for her. The hypoglycemia and the hyperkalemia were corrected.  The exact etiology of these  is unknown.  It was thought that the patient may have fluctuating blood sugars as part of a diagnosis of diabetes mellitus.  However, hemoglobin A1C was checked and was normal.  As stated before, the patient did have slight bradycardia at presentation.  This persisted after admission, and there were several occasions of profound bradycardia into the high 30s and low 40s, without significant symptoms and without drop in blood pressure.  This was worrisome, and cardiology consult was obtained.  She was  further monitored on telemetry and then went into atrial fibrillation with rapid ventricular response.  Rate was controlled with Cardizem, and the patient was anticoagulated with heparin.  Persantine Cardiolite was obtained to rule out ischemic causes of what appeared to be sick sinus syndrome.  The Cardiolite was negative.  The plan was then to continue rate control with Cardizem and to insert a pacemaker to maintain heart rate at a satisfactory level.  This was done on June 27, 2000, and the patients pacer was set at 65 beats per minute prior to discharge.  She was discharged home on Cardizem CD 180 mg p.o. q.d. for rate control.  After her brief of atrial fibrillation with rapid ventricular response, she did revert back to normal sinus rhythm and was in sinus rhythm on discharge.  The patient was not sent home on any anticoagulation other than an aspirin per day.  #2 - HYPERTENSION:  The patient did have a diagnosis of hypertension upon admission and had been taking Toprol XL.  However, due to her bradycardia at admission this was discontinued.  She did require some Norvasc for hypertension during the hospitalization as well as some hydrochlorothiazide. However, when she developed atrial fibrillation with rapid ventricular response she was started on Cardizem, and the other antihypertensives were discontinued.  She was discharged home on the Cardizem, as previously stated. She may need further antihypertensive medication, and this will require follow-up as an outpatient.  #3 - URINE CULTURE POSITIVE:  This is believed to be a mistake.  The patient had a urinalysis on admission which was normal, and a urine culture was negative.  The patient accidentally had a urine culture attributed to her later in the hospitalization, but it is believed that this was another patients specimen.  This specimen did grow vancomycin-resistant enterococcus. The patient was put on contact precautions,  and a urine was sent for culture  to confirm this result.  Reculture of her urine grew 4000 colonies of strep group B.  No vancomycin-resistant enterococcus was grown.  It is believed that this is a mistake, and this patient should not be labeled with having VRE in her urine.  It is especially unlikely in this patient who has never been a nursing home resident and has no recent hospitalizations.  PERTINENT LABORATORY DATA:  Basic metabolic panel at discharge:  Sodium 139, potassium 3.9, chloride 107, bicarbonate 22, BUN 27, creatinine 1.3, glucose 89, calcium 9.4.  White blood cell count 10.0, hemoglobin 13.3, platelets 225,000.  Erythrocyte sedimentation rate 10.  Hemoglobin A1C 5.0.  CEA 2.4. TSH 0.66.  DISPOSITION:  The patient was discharged home on June 29, 2000, with the previously mentioned discharge medications.  DISCHARGE INSTRUCTIONS:  She was instructed to use a walker or cane or other people for assistance while walking if she was feeling any dizziness.  She was instructed to not wash the pacemaker site for four days and will not remove the strips.  She was told not to stretch or lift her  right arm above her head. To call [phone] if swelling or bruising or drainage should appear at the site.  FOLLOW-UP:  A follow-up appointment was made with Dr. Alanda Amass for July 10, 2000, at 11 a.m.  The patient at the time of discharge has not decided where she was going to get primary care physician follow-up, so this appointment was left to her.  CONDITION ON DISCHARGE:  The patient was discharged home in good condition. DD: 06/29/00 TD:  06/30/00 Job: [id number]

## 2010-10-15 NOTE — Op Note (Signed)
[place]. [facility]  Patient:    Erica Stanton, Erica Stanton                     MRN: [id number] Proc. Date: 06/27/00 Adm. Date:  [id number] Attending:  McDiarmid, Leighton Roach CC:         Cardiac Catheterization Laboratory  Mena Pauls, M.D.  Lenise Herald, M.D.   Operative Report  PROCEDURE:  Implantation of permanent DDDR Pacesetter "Integrity AFX-DR" model [id number], [id number], multiprogrammable rate-responsive pulse generator with passive fixation, Pacesetter steroid-alluding in-line coaxial silicone finned #[id number] ventricular electrode, [id number], and tinned 46 cm Pacesetter #1342 in-line coaxial silicone IS1 J-tipped 1342T, [id number] electrode.  IMPLANTING PHYSICIAN:  Richard A. Alanda Amass, M.D. and Lenise Herald, M.D.  PRIMARY OPERATOR:  Lenise Herald, M.D.  COMPLICATIONS:  None.  ESTIMATED BLOOD LOSS:  Approximately 30 cc.  ANESTHESIA:  Valium 5 mg p.o. premedication, 1% Xylocaine, Nubain 3 mg IV in divided doses.  PREOPERATIVE DIAGNOSES: 1. Frank syncope, related to bradycardia, negative neurologic evaluation. 2. Chronic history of vertigo, unrelated to syncope. 3. Sick sinus syndrome with documented supraventricular tachycardia and    symptomatic bradycardia. 4. Systemic hypertension.  POSTOPERATIVE DIAGNOSES: 1. Frank syncope, related to bradycardia, negative neurologic evaluation. 2. Chronic history of vertigo, unrelated to syncope. 3. Sick sinus syndrome with documented supraventricular tachycardia and    symptomatic bradycardia. 4. Systemic hypertension.  THRESHOLD: Unipolar ventricle:  R 18.4 MV, impedance 700 ohms, minimal threshold 0.5 V. Bipolar ventricular: R 20 MV, impedance 820 ohms, threshold 0.4 V. Unipolar atrial:  V 1.0 MV, impedance 480 ohms, minimal threshold 0.7 V. Bipolar atrial:  V1.7 MV, impedance 500 ohms, threshold 0.7 V.  MAGNET RATE: At DOL: 98.6. At RRT: Dropped to 86.3. EOL:  Dropped to 68  beats/min.  RETROGRADE CONDUCTION STUDIES:  There was 1:1 long retrograde conduction at a pacing rate of 80, and there was retrograde Wenckebach at a pacing rate of 90 or greater.  INTERCARDIAC ELECTROGRAM:  Showed biphasic R-waves with good current of injury and slew rate.  The generator conformed to the manufacturers specification on PSA testing prior to implant at 0.4 msec with 3.3 V output on each channel.  DESCRIPTION OF PROCEDURE:  The patient was brought to the second floor CP laboratory in the postabsorptive state after 5 mg of Valium p.o. premedication.  A right infraclavicular curvilinear transverse incision was performed.  This was brought down to the prepectoral fascia using blunt dissection and electrocautery to control hemostasis.  A pacemaker pocket was formed using blunt dissection in the prepectoral fascial plane.  The subclavian was entered with a #18 thin-walled needle, and using tandem #10 peel-away cook introducers and a stainless steel J-tipped guide wire which was retained, the atrial and the ventricular electrolytes were introduced in tandem.  The ventricular electrode was positioned near the RV apex in stable position. The atrial electrode was positioned in the right atrial appendage, confirmed by fluoroscopy and rotational maneuvers, and was stable.  The threshold testing was performed as outlined above, along with retrograde conduction study.  The electrodes were secured at the insertion site where the previously-placed #1 figure-of-eight silk suture around a tissue sewing collar to control hemostasis and prevent migration.  They were further secured with two interrupted #1 silk sutures around a silicone sewing collar  for each electrode.  The pocket was irrigated with 500 mg of kanamycin solution.  The generator conformed to the manufacturers specifications on PSA testing. Atrial cardiac electrograms were performed.  The  electrodes were hooked to  the generator in proper atrial and ventricular sequence, and single hex nut tightened.  The generator was delivered into the pocket with the electrode looped behind and loosely secured to the underlying muscle and fascia with #1 silk suture, to prevent migration.  The sponge count was correct.  The subcutaneous tissue was closed with two separate layers of #2-0 Dexon suture, and the skin was closed with #5-0 subcuticular Dexon suture.  Steri-Strips were applied.  The fluoroscopy showed good position of the atrial and ventricular electrodes.  There was no pneumothorax.  The patient tolerated the procedure well and was transferred to the holding area for a postoperative programming. DD:  06/27/00 TD:  06/27/00 Job: [id number]

## 2010-10-15 NOTE — Assessment & Plan Note (Signed)
[facility]                           GASTROENTEROLOGY OFFICE NOTE   Erica Stanton, Erica Stanton                     MRN:          [id number]  DATE:03/01/2006                            DOB:          Jan 17, 1928    REFERRING PHYSICIAN:  Olene Craven, M.D.   REASON FOR REFERRAL:  Lower abdominal pain.   HISTORY OF PRESENT ILLNESS:  Erica Stanton is a 77 year old white female that  I have seen in the past.  She was referred to see me by Dr. Kern Reap.  She relates a several-week history of lower abdominal pain.  She has had  some postprandial nausea.  Her abdominal pain does not appear to be related  to meals, bowel movements or any other digestive function.  It is described  as a constant bothersome pain.  She was evaluated by Dr. Barbee Shropshire for the  same complaint and underwent a CT scan of the abdomen and pelvis, as well as  an abdominal ultrasound, all on September 24th.  There is a hemangioma in  the mid-liver, a small midline hernia containing fat and cholelithiasis.  She states she has eaten less due to the postprandial nausea.  She denies  any fevers, chills, change in bowel habits, melena, hematochezia or change  in stool caliber.  She has a history of colon cancer arising in a sigmoid  colon polyp, and she is status post a segmental left colectomy in September  of 1997.  She has a brother with colon cancer as well.  Her last colonoscopy  was in September of 2006, which revealed 3 diminutive polyps,  diverticulosis, internal hemorrhoids and a prior left hemicolectomy.   PAST MEDICAL HISTORY:  As in history of present illness.  In addition,  hypertension, hyperlipidemia, GERD, status post pacemaker placement, status  post hysterectomy.   CURRENT MEDICATIONS:  Listed on the chart, data reviewed.   MEDICATIONS:  ALLERGIES TO PENICILLIN.   Social history and review of systems were the handwritten form.   PHYSICAL EXAMINATION:  GENERAL:   No acute distress.  VITAL SIGNS:  Height 5 feet 5-1/2 inches, weight 184 pounds.  Blood pressure  is 122/60, pulse 68 and regular.  HEENT:  Anicteric sclerae, oropharynx clear.  CHEST:  Clear to auscultation bilaterally.  CARDIAC:  Regular rate and rhythm without murmurs appreciated.  ABDOMEN:  Soft, nontender, nondistended.  Normoactive bowel sounds.  No  palpable organomegaly, masses or hernias.  RECTAL:  Reveals no lesions and heme-occult negative stool in the vault.  EXTREMITIES:  Without clubbing, cyanosis, edema.  NEUROLOGIC:  Alert and oriented x3, __________  nonfocal.   ASSESSMENT/PLAN:  Lower abdominal pain associated with nausea and a slight  weight loss.  Etiology not clear.  She may have symptomatic cholelithiasis.  Obtain a CBC, CMET and lipase today.  Return office visit in 1 to 2 weeks or  re-evaluation.  She is to call in the interim if her symptoms worsen.  Continue AcipHex 20 mg p.o. q.a.m along with standard anti-reflux measures.       Erica Stanton. Russella Dar, MD, [facility]      MTS/MedQ  DD:  03/06/2006  DT:  03/07/2006  Job #:  [id number]   cc:   Olene Craven, M.D.

## 2010-10-15 NOTE — Consult Note (Signed)
[place]. [facility]  Patient:    Erica Stanton, Erica Stanton                     MRN: [id number] Proc. Date: 06/20/00 Adm. Date:  [id number] Attending:  McDiarmid, Leighton Roach.                          Consultation Report  PATIENT ADDRESS:  [address], [place], [place], [address]  DATE OF BIRTH:  [birth year]  REASON FOR CONSULTATION:  This 77 year old right-handed white married female with a history of recurrent vertigo is seen for evaluation of headaches and syncope.  HISTORY OF PRESENT ILLNESS:  [name] has a greater than two-year history of vertigo characterized by episodes of spinning occurring in the sitting, lying, and standing position aggravated by turning her head, looking up, or looking down.  These episodes are usually not associated with headache or vomiting but can be associated with nausea.  She was evaluated by Dr. Catalina Antigua in 1999 and underwent an MRI study of the brain December 22, 1997 with contrast enhancement which showed bifrontal right greater than left subcortical white matter lesions and possible mild ischemia in the left side of the pons.  She did not have an MR angiogram at that time.  The patient was also seen by Dr. Lenore Cordia, an ear, nose, and throat physician in [place], who found no definite evidence of a peripheral vestibulopathy. She is maintained on aspirin daily and started on Antivert with significant improvement in her symptomatology.  No other significant neurologic complaints occurred.  She was in her usual state of health until Sunday, June 18, 2000 when she noted the onset of right temporal headache while at church, and went home.  She was getting ready to put food in the dogs food bowl, bent forward, and noted a light-headed sensation and then called out to her son that she did not feel well.  She then had syncope.  There was no associated chest pain or palpitation.  There was no recognized  seizure activity.  There was no associate urinary or bowel incontinence or tongue biting.  She had taken a Tylenol because she felt unusual just prior to putting the food in the dog bowl.  She was brought to the emergency room.  En route, she was found to have a capillary blood glucose in the 30s and was treated with D-50-W with an increase in her blood sugar to 135.  PAST MEDICAL HISTORY:  Significant for hypertension, dizziness, colon cancer, status post partial colectomy, status post back surgery x 3.  MEDICATIONS: 1. Toprol-XL 100 mg q.d. 2. Meclizine 25 mg q.d. 3. Atacand 30 mg q.d. 4. Aspirin one p.o. q.d.  SOCIAL HISTORY:  She does not smoke cigarettes or drink alcohol.  She has no history of head trauma.  PHYSICAL EXAMINATION:  GENERAL:  Revealed a well-developed, obese white female.  VITAL SIGNS:  Sitting blood pressure left and right arm of 160/80 and lying blood pressure of 160/80, standing in the left arm 160/80.  Heart rate is 80 and regular.  NECK:  There were no bruits.  The neck was supple.  NEUROLOGIC:  She was alert and oriented x 3.  Her cranial nerve examination revealed no nystagmus.  Red lens testing was unremarkable.  Pupils reactive from 4 to 3 bilaterally.  Corneals were present.  Facial sensation was equal. There was no seventh nerve palsy.  Hearing was decreased, right greater than left.  Tongue was midline.  The uvula was midline.  Gags were present. Sternocleidomastoid, trapezius testing, and all motor examination revealed good strength upper and lower extremities.  Coordination testing revealed finger-to-nose and heel-to-shin to be well done.  Sensory examination was intact to pinprick, touch, ______ position, and vibration.  Testing of her deep tendon reflexes were 2+ and plantar responses were downgoing.  IMPRESSION: 1. Headache right temporal region, code 784.0.  Etiology unknown. 2. Syncope, code 780.2.  Possibly secondary to  hypoglycemia. 3. Positional vertigo, code 780.4.  The plan at this time is to obtain an MRI study and intracranial MRA for further evaluation. DD:  06/20/00 TD:  06/20/00 Job: [id number]

## 2010-10-15 NOTE — Op Note (Signed)
NAMELACOSTA, HARGAN              ACCOUNT NO.:  [id number]   MEDICAL RECORD NO.:  [id number]          PATIENT TYPE:  AMB   LOCATION:  DAY                          FACILITY:  [facility]   PHYSICIAN:  Anselm Pancoast. Weatherly, M.D.DATE OF BIRTH:  1927-07-31   DATE OF PROCEDURE:  03/29/2006  DATE OF DISCHARGE:                                 OPERATIVE REPORT   PREOPERATIVE DIAGNOSIS:  Chronic cholecystitis with stones.   POSTOPERATIVE DIAGNOSIS:  Chronic cholecystitis with stones.   OPERATIONS:  Laparoscopic cholecystectomy with no cholangiogram.   SURGEON:  Anselm Pancoast. Zachery Dakins, M.D.   ASSISTANT:  Rose Phi. Maple Hudson, M.D.   HISTORY:  Merit Gadsby is a 77 year old female who was referred to [place] by  Dr. __________ who has seen her recently with episodes of epigastric pain.  She has had known gallstones for 7-8 years and had a hysterectomy through a  large midline incision years ago and then a sigmoid colectomy for cancer by  Dr. Samuella Cota about ten years ago.  She has obviously got adhesions. Dr. Russella Dar  has done a colonoscopy about two years ago that showed no evidence of any  new problems in the colon.  One Alanda Amass is her cardiologist and she has  been cleared and is now for a laparoscopic cholecystectomy.  I hope that I  can do it with a laparoscopic. She is allergic to PENICILLIN and was given  Cipro intravenously preoperatively.   DESCRIPTION OF PROCEDURE:  She was positioned on the OR table and PAS  stockings were applied, induction of general anesthesia, and the abdomen was  prepped with Betadine solution and draped in a sterile manner.  I made a  small incision below the umbilicus.  The fascia was picked up. You could see  the stitches from where the midline incision is and then we carefully picked  up the fascia, made a little opening through it, and then kind of finger  dissected in the fatty tissue.  Her tissues had very little resistance and  we dissected upward and laterally trying  to get into the free peritoneal  cavity.  We first put a pursestring in the fascia but were still kind of in  the adhesions. We took the camera and the [facility] cannula out and then with  finger dissection I was able to get up into the free peritoneum of the right  upper quadrant, reinserted the pursestring and Hassan cannula, and then  could get the camera in.  She has got definite adhesions but I was able to  kind of see and I could get the upper 10 mm trocar in under direct vision to  the right upper where the midline incision is.  Then, with this in place, I  was able to take the dissector and drop down some of the adhesions so we  could see in the right upper quadrant and Dr. Maple Hudson placed the two 5 mm  ports under direct vision.   The gallbladder was chronically inflamed with adhesions around it and the  gallbladder was retracted upward and the lesions were taken down very  carefully.  Then, the proximal portion of the gallbladder I could identify  the cystic artery and the cystic duct. They were close together and I could  encompass the cystic duct but then one of the retractors that Dr. Maple Hudson was  holding slipped and there was a little bile spillage and we elected to go  ahead and doubly clip the cystic duct proximally, singly distally and not do  a cholangiogram, it is a fairly large stone and a small cystic duct.  With  this then, we could then see the cystic artery better, it was doubly clipped  proximally and divided.  Then, we freed up the adhesions from the posterior  surface of the gallbladder under direct vision and placed the gallbladder  within the EndoCatch bag. We then cauterized the base of the gallbladder.  There had been one little area where there were a little bleeding from an  adhesion that had been on the surface of the liver and I used a piece of  Surgicel in the gallbladder fossa.  We irrigated, aspirated, placed the  gallbladder in an EndoCatch bag and then  brought the gallbladder out through  the umbilicus and reinserted the [facility] cannula.   Next, I elected, since she had so much adhesions to the mid surface that I  loosely dissected our way through, I elected to put a 19 Blake drain into  the right upper quadrant and this was sutured to the skin with the drain up  under the bed of the liver and we placed a piece of Surgicel up on the raw  surface of the liver.  We irrigated, the fluid had been aspirated.  There  was no evidence of any bleeding or bile drainage.  I then placed the camera  back in the upper 10 mm trocar and placed a figure-of-eight in addition to  the pursestring at the fascia of the umbilicus and then tied those and then  anesthetized the fascia.  I then used the camera and you could see no  evidence of any bleeding where we had dropped down these adhesions from the  omentum and could see no evidence any bowel contents or anything that looked  like intestinal trauma damage and then released carbon dioxide, withdrew the  other 5 mm port, the drain having been placed through the most lateral 5 mm  port.  I placed a simple suture in the upper fascia under direct vision and  anesthetized all the ports.  I closed the subcutaneous tissue with 4-0  Vicryl.  Benzoin and Steri-Strips on the skin.  The patient tolerated the  procedure nicely and we will admit her for chronic medications. Whether she  will be ready go home tomorrow or Friday will be determined by how sore she  is in the morning.  She is not on Coumadin for her cardiac disease.           ______________________________  Anselm Pancoast. Zachery Dakins, M.D.     WJW/MEDQ  D:  03/29/2006  T:  03/29/2006  Job:  [id number]   cc:   Gerlene Burdock A. Alanda Amass, M.D.  Fax: [phone]   Venita Lick. Russella Dar, MD, FACG  [address]  [place]  [place] [address]

## 2011-03-01 ENCOUNTER — Encounter: Payer: Self-pay | Admitting: Gastroenterology

## 2011-03-14 LAB — BASIC METABOLIC PANEL
CO2: 25
CO2: 25
Calcium: 8.8
Calcium: 8.9
Calcium: 9.1
Chloride: 100
Chloride: 104
Creatinine, Ser: 1.46 — ABNORMAL HIGH
Creatinine, Ser: 1.62 — ABNORMAL HIGH
GFR calc Af Amer: 37 — ABNORMAL LOW
GFR calc Af Amer: 42 — ABNORMAL LOW
GFR calc Af Amer: 45 — ABNORMAL LOW
GFR calc non Af Amer: 35 — ABNORMAL LOW
Glucose, Bld: 134 — ABNORMAL HIGH
Potassium: 5.1
Sodium: 131 — ABNORMAL LOW
Sodium: 135

## 2011-03-14 LAB — CBC
Hemoglobin: 12.3
Hemoglobin: 12.9
MCHC: 33.5
MCHC: 33.5
MCV: 82.6
MCV: 82.8
RBC: 4.4
RBC: 4.41
RBC: 4.65
RDW: 14.7 — ABNORMAL HIGH
WBC: 8

## 2011-03-14 LAB — COMPREHENSIVE METABOLIC PANEL
CO2: 26
Calcium: 8.8
Creatinine, Ser: 1.41 — ABNORMAL HIGH
GFR calc Af Amer: 44 — ABNORMAL LOW
GFR calc non Af Amer: 36 — ABNORMAL LOW
Glucose, Bld: 81
Sodium: 138
Total Protein: 6.7

## 2011-03-14 LAB — CARDIAC PANEL(CRET KIN+CKTOT+MB+TROPI)
CK, MB: 0.5
Relative Index: 0.3
Total CK: 167

## 2011-03-14 LAB — DIFFERENTIAL
Lymphocytes Relative: 26
Lymphs Abs: 1.8
Monocytes Relative: 6
Neutrophils Relative %: 66

## 2011-03-14 LAB — TSH: TSH: 2.224

## 2011-03-14 LAB — MAGNESIUM: Magnesium: 2.4

## 2011-07-02 ENCOUNTER — Emergency Department (HOSPITAL_COMMUNITY): Payer: Medicare Other

## 2011-07-02 ENCOUNTER — Encounter (HOSPITAL_COMMUNITY): Payer: Self-pay | Admitting: *Deleted

## 2011-07-02 ENCOUNTER — Inpatient Hospital Stay (HOSPITAL_COMMUNITY)
Admission: EM | Admit: 2011-07-02 | Discharge: 2011-07-04 | DRG: 069 | Disposition: A | Discharge status: Home Health | Payer: Medicare Other | Source: Ambulatory Visit | Attending: Internal Medicine | Admitting: Internal Medicine

## 2011-07-02 ENCOUNTER — Other Ambulatory Visit: Payer: Self-pay

## 2011-07-02 DIAGNOSIS — K3184 Gastroparesis: Secondary | ICD-10-CM | POA: Diagnosis present

## 2011-07-02 DIAGNOSIS — I129 Hypertensive chronic kidney disease with stage 1 through stage 4 chronic kidney disease, or unspecified chronic kidney disease: Secondary | ICD-10-CM | POA: Diagnosis present

## 2011-07-02 DIAGNOSIS — R531 Weakness: Secondary | ICD-10-CM

## 2011-07-02 DIAGNOSIS — N184 Chronic kidney disease, stage 4 (severe): Secondary | ICD-10-CM

## 2011-07-02 DIAGNOSIS — K219 Gastro-esophageal reflux disease without esophagitis: Secondary | ICD-10-CM | POA: Diagnosis present

## 2011-07-02 DIAGNOSIS — F0390 Unspecified dementia without behavioral disturbance: Secondary | ICD-10-CM | POA: Diagnosis present

## 2011-07-02 DIAGNOSIS — E785 Hyperlipidemia, unspecified: Secondary | ICD-10-CM | POA: Diagnosis present

## 2011-07-02 DIAGNOSIS — D72829 Elevated white blood cell count, unspecified: Secondary | ICD-10-CM | POA: Diagnosis present

## 2011-07-02 DIAGNOSIS — Z950 Presence of cardiac pacemaker: Secondary | ICD-10-CM

## 2011-07-02 DIAGNOSIS — R634 Abnormal weight loss: Secondary | ICD-10-CM | POA: Diagnosis present

## 2011-07-02 DIAGNOSIS — G459 Transient cerebral ischemic attack, unspecified: Secondary | ICD-10-CM

## 2011-07-02 DIAGNOSIS — Z7982 Long term (current) use of aspirin: Secondary | ICD-10-CM

## 2011-07-02 DIAGNOSIS — K2970 Gastritis, unspecified, without bleeding: Secondary | ICD-10-CM | POA: Diagnosis present

## 2011-07-02 DIAGNOSIS — R202 Paresthesia of skin: Secondary | ICD-10-CM

## 2011-07-02 DIAGNOSIS — Z79899 Other long term (current) drug therapy: Secondary | ICD-10-CM

## 2011-07-02 DIAGNOSIS — R1013 Epigastric pain: Secondary | ICD-10-CM | POA: Diagnosis present

## 2011-07-02 DIAGNOSIS — Z85038 Personal history of other malignant neoplasm of large intestine: Secondary | ICD-10-CM

## 2011-07-02 HISTORY — DX: Chronic kidney disease, stage 4 (severe): N18.4

## 2011-07-02 HISTORY — DX: Presence of cardiac pacemaker: Z95.0

## 2011-07-02 HISTORY — DX: Hyperlipidemia, unspecified: E78.5

## 2011-07-02 HISTORY — DX: Transient cerebral ischemic attack, unspecified: G45.9

## 2011-07-02 HISTORY — DX: Essential (primary) hypertension: I10

## 2011-07-02 LAB — CBC
MCH: 27.2 pg (ref 26.0–34.0)
MCHC: 30.5 g/dL (ref 30.0–36.0)
Platelets: 309 10*3/uL (ref 150–400)

## 2011-07-02 LAB — POCT I-STAT, CHEM 8
BUN: 31 mg/dL — ABNORMAL HIGH (ref 6–23)
Creatinine, Ser: 2 mg/dL — ABNORMAL HIGH (ref 0.50–1.10)
Potassium: 4.6 mEq/L (ref 3.5–5.1)
Sodium: 142 mEq/L (ref 135–145)

## 2011-07-02 LAB — URINALYSIS, ROUTINE W REFLEX MICROSCOPIC
Glucose, UA: 100 mg/dL — AB
Leukocytes, UA: NEGATIVE
Specific Gravity, Urine: 1.018 (ref 1.005–1.030)
Urobilinogen, UA: 0.2 mg/dL (ref 0.0–1.0)

## 2011-07-02 LAB — GLUCOSE, CAPILLARY: Glucose-Capillary: 122 mg/dL — ABNORMAL HIGH (ref 70–99)

## 2011-07-02 LAB — DIFFERENTIAL
Basophils Absolute: 0.1 10*3/uL (ref 0.0–0.1)
Basophils Relative: 0 % (ref 0–1)
Eosinophils Absolute: 0.2 10*3/uL (ref 0.0–0.7)
Neutrophils Relative %: 77 % (ref 43–77)

## 2011-07-02 LAB — COMPREHENSIVE METABOLIC PANEL
ALT: 10 U/L (ref 0–35)
Albumin: 3.2 g/dL — ABNORMAL LOW (ref 3.5–5.2)
Alkaline Phosphatase: 70 U/L (ref 39–117)
Potassium: 4.5 mEq/L (ref 3.5–5.1)
Sodium: 140 mEq/L (ref 135–145)
Total Protein: 6.7 g/dL (ref 6.0–8.3)

## 2011-07-02 LAB — PROTIME-INR
INR: 1.03 (ref 0.00–1.49)
Prothrombin Time: 13.7 seconds (ref 11.6–15.2)

## 2011-07-02 LAB — CK TOTAL AND CKMB (NOT AT ARMC): Total CK: 22 U/L (ref 7–177)

## 2011-07-02 LAB — URINE MICROSCOPIC-ADD ON

## 2011-07-02 MED ORDER — ONDANSETRON HCL 4 MG PO TABS: 4.0000 mg | ORAL_TABLET | Freq: Four times a day (QID) | ORAL | Status: DC | PRN

## 2011-07-02 MED ORDER — SIMVASTATIN 20 MG PO TABS
20.0000 mg | ORAL_TABLET | Freq: Every day | ORAL | Status: DC
  Administered 2011-07-02 – 2011-07-03 (×2): 20 mg via ORAL
  Filled 2011-07-02 (×3): qty 1

## 2011-07-02 MED ORDER — CLONIDINE HCL 0.1 MG PO TABS
0.1000 mg | ORAL_TABLET | Freq: Three times a day (TID) | ORAL | Status: DC | PRN
  Filled 2011-07-02: qty 1

## 2011-07-02 MED ORDER — DIGOXIN 125 MCG PO TABS
125.0000 ug | ORAL_TABLET | Freq: Every day | ORAL | Status: DC
  Administered 2011-07-03 – 2011-07-04 (×2): 125 ug via ORAL
  Filled 2011-07-02 (×2): qty 1

## 2011-07-02 MED ORDER — AMITRIPTYLINE HCL 10 MG PO TABS
10.0000 mg | ORAL_TABLET | Freq: Two times a day (BID) | ORAL | Status: DC
  Administered 2011-07-02 – 2011-07-04 (×4): 10 mg via ORAL
  Filled 2011-07-02 (×5): qty 1

## 2011-07-02 MED ORDER — DONEPEZIL HCL 10 MG PO TABS
10.0000 mg | ORAL_TABLET | Freq: Every day | ORAL | Status: DC
  Administered 2011-07-02 – 2011-07-03 (×2): 10 mg via ORAL
  Filled 2011-07-02 (×3): qty 1

## 2011-07-02 MED ORDER — GUAIFENESIN-DM 100-10 MG/5ML PO SYRP: 5.0000 mL | ORAL_SOLUTION | ORAL | Status: DC | PRN

## 2011-07-02 MED ORDER — ALBUTEROL SULFATE (5 MG/ML) 0.5% IN NEBU: 2.5000 mg | INHALATION_SOLUTION | RESPIRATORY_TRACT | Status: DC | PRN

## 2011-07-02 MED ORDER — ONDANSETRON HCL 4 MG/2ML IJ SOLN: 4.0000 mg | Freq: Four times a day (QID) | INTRAMUSCULAR | Status: DC | PRN

## 2011-07-02 MED ORDER — FENOFIBRATE 54 MG PO TABS
54.0000 mg | ORAL_TABLET | Freq: Every day | ORAL | Status: DC
  Filled 2011-07-02: qty 1

## 2011-07-02 MED ORDER — SODIUM CHLORIDE 0.9 % IJ SOLN
3.0000 mL | Freq: Two times a day (BID) | INTRAMUSCULAR | Status: DC
  Administered 2011-07-02 – 2011-07-04 (×4): 3 mL via INTRAVENOUS

## 2011-07-02 MED ORDER — FENOFIBRATE 54 MG PO TABS
54.0000 mg | ORAL_TABLET | Freq: Every day | ORAL | Status: DC
  Administered 2011-07-03 – 2011-07-04 (×2): 54 mg via ORAL
  Filled 2011-07-02 (×2): qty 1

## 2011-07-02 MED ORDER — HYDROCODONE-ACETAMINOPHEN 5-325 MG PO TABS: 1.0000 | ORAL_TABLET | ORAL | Status: DC | PRN

## 2011-07-02 NOTE — ED Notes (Signed)
RN still in pt's room and charge nurse unable to take report at this time.

## 2011-07-02 NOTE — ED Notes (Signed)
Patient being transported upstairs on portable cardiac monitor with RN. 

## 2011-07-02 NOTE — ED Notes (Signed)
Pt reports onset approx 2:30 of right arm weakness/numbness, family reports slurred speech. Airway is intact, pt following commands appropriately at triage.

## 2011-07-02 NOTE — ED Notes (Addendum)
RN getting pt is another pt's room and charge is occupied at this time as well. Both unable to take report at this time.

## 2011-07-02 NOTE — H&P (Addendum)
Erica Stanton CSN:[id number],MRN:[id number]  Outpatient Primary MD for the patient is Sibyl Parr, MD, MD  With History of -  Past Medical History  Diagnosis Date  . Cancer   . Hx of hernia repair   . Dementia   . Pacemaker   . Hypertension   . Hyperlipemia     CKD-4  Past Surgical History  Procedure Date  . Abdominal hysterectomy    pacemaker placement  in for   Chief Complaint  Patient presents with  . Code Stroke     HPI  Erica Stanton  is a 78 y.o. female,  with history of hypertension, dyslipidemia, CTD stage IV, as instructed ER with sudden onset of left-sided weakness and tingling in his started abruptly at home, no headache no vision no swallowing no speech problems, by the time she arrived in the ER her weakness had resolved at this point she has only mild tingling in her left arm. She was seen by neurology and had an unremarkable CT brain. I was called to admit the patient for possible TIA/resolving stroke.    Review of Systems    In addition to the HPI above,  No Fever-chills, No Headache, No changes with Vision or hearing, No problems swallowing food or Liquids, No Chest pain, Cough or Shortness of Breath, No Abdominal pain, No Nausea or Vommitting, Bowel movements are regular, No Blood in stool or Urine, No dysuria, No new skin rashes or bruises, No new joints pains-aches,  No recent weight gain or loss, No polyuria, polydypsia or polyphagia, No significant Mental Stressors.  A full 10 point Review of Systems was done, except as stated above, all other Review of Systems were negative.   Social History History  Substance Use Topics  . Smoking status: Not on file  . Smokeless tobacco: Not on file  . Alcohol Use: No      Family History History of pancreatic cancer in patient's sister  Prior to Admission medications   Medication Sig Start Date End Date Taking? Authorizing Provider  amitriptyline (ELAVIL) 10 MG tablet Take 10 mg by mouth 2  (two) times daily.   Yes Historical Provider, MD  aspirin 81 MG chewable tablet Chew 81 mg by mouth daily.   Yes Historical Provider, MD  digoxin (LANOXIN) 0.125 MG tablet Take 125 mcg by mouth daily.   Yes Historical Provider, MD  donepezil (ARICEPT) 10 MG tablet Take 10 mg by mouth at bedtime as needed.   Yes Historical Provider, MD  fenofibrate (TRICOR) 48 MG tablet Take 48 mg by mouth daily.   Yes Historical Provider, MD  losartan (COZAAR) 100 MG tablet Take 100 mg by mouth daily.   Yes Historical Provider, MD  simvastatin (ZOCOR) 20 MG tablet Take 20 mg by mouth every evening.   Yes Historical Provider, MD    Allergies  Allergen Reactions  . Penicillins     REACTION: rash    Physical Exam  Vitals  Blood pressure 166/90, pulse 80, temperature 98.1 F (36.7 C), temperature source Oral, resp. rate 18, SpO2 97.00%.   1. General  Caucasian female lying in bed in NAD,    2. Normal affect and insight, Not Suicidal or Homicidal, Awake Alert, Oriented *3.  3. No F.N deficits, ALL C.Nerves Intact, Strength 5/5 all 4 extremities, Sensation intact all 4 extremities, Plantars down going.  4. Ears and Eyes appear Normal, Conjunctivae clear, PERRLA. Moist Oral Mucosa.  5. Supple Neck, No JVD, No cervical lymphadenopathy appriciated, No Carotid Bruits.  6.  Symmetrical Chest wall movement, Good air movement bilaterally, CTAB.  7. RRR, No Gallops, Rubs or Murmurs, No Parasternal Heave. Right chest wall has a pacemaker  8. Positive Bowel Sounds, Abdomen Soft, Non tender, No organomegaly appriciated,       No rebound -guarding or rigidity.  9.  No Cyanosis, Normal Skin Turgor, No Skin Rash or Bruise.  10. Good muscle tone,  joints appear normal , no effusions, Normal ROM.  11. No Palpable Lymph Nodes in Neck or Axillae     Data Review  CBC  Lab 07/02/11 1534 07/02/11 1526  WBC -- 13.8*  HGB 14.3 13.1  HCT 42.0 42.9  PLT -- 309  MCV -- 89.0  MCH -- 27.2  MCHC -- 30.5  RDW  -- 14.0  LYMPHSABS -- 2.1  MONOABS -- 0.8  EOSABS -- 0.2  BASOSABS -- 0.1  BANDABS -- --   ------------------------------------------------------------------------------------------------------------------ Chemistries   Lab 07/02/11 1534 07/02/11 1526  NA 142 140  K 4.6 4.5  CL 112 107  CO2 -- 22  GLUCOSE 133* 132*  BUN 31* 28*  CREATININE 2.00* 2.07*  CALCIUM -- 9.3  MG -- --  AST -- 20  ALT -- 10  ALKPHOS -- 70  BILITOT -- 0.2*   ------------------------------------------------------------------------------------------------------------------ CrCl is unknown because both a height and weight (above a minimum accepted value) are required for this calculation. ------------------------------------------------------------------------------------------------------------------ No results found for this basename: TSH,T4TOTAL,FREET3,T3FREE,THYROIDAB in the last 72 hours  Coagulation profile  Lab 07/02/11 1526  INR 1.03  PROTIME --   ------------------------------------------------------------------------------------------------------------------- No results found for this basename: DDIMER:2 in the last 72 hours ------------------------------------------------------------------------------------------------------------------- Cardiac Enzymes  Lab 07/02/11 1526  CKMB 1.9  TROPONINI <0.30  MYOGLOBIN --   ------------------------------------------------------------------------------------------------------------------ No components found with this basename: POCBNP:3 ------------------------------------------------------------------------------------------------------------------  Imaging results:   Ct Head Wo Contrast  07/02/2011  *RADIOLOGY REPORT*  Clinical Data: Acute stroke.  Slurred speech and right-sided facial numbness.  CT HEAD WITHOUT CONTRAST  Technique:  Contiguous axial images were obtained from the base of the skull through the vertex without contrast.   Comparison: 09/29/2010  Findings: There is no evidence of intracranial hemorrhage, brain edema or other signs of acute infarction.  There is no evidence of intracranial mass lesion or mass effect.  No abnormal extra-axial fluid collections are identified.  Mild diffuse cerebral atrophy appears stable.  Chronic small vessel disease is also unchanged. The Panda thinner infarcts are seen involving the basal ganglia bilaterally and right pons.  No evidence of hydrocephalus.  No skull abnormality identified.  IMPRESSION:  1.  No acute intracranial abnormality. 2.  Stable mild cerebral atrophy, chronic small vessel disease, and old lacunar infarcts.  Original Report Authenticated By: Danae Orleans, M.D.   Dg Chest Port 1 View  07/02/2011  *RADIOLOGY REPORT*  Clinical Data: Cough.  PORTABLE CHEST - 1 VIEW  Comparison: 09/29/2010  Findings: Cardiomegaly and tortuosity of the thoracic aorta are stable.  Both lungs are clear.  No evidence of pleural effusion. Dual lead transvenous pacemaker remains in stable position.  IMPRESSION: Stable cardiomegaly.  No acute findings.  Original Report Authenticated By: Danae Orleans, M.D.    My personal review of EKG: Rhythm Paced a     Assessment & Plan  1.Right-sided weakness and tingling - symptoms have almost resolved except for mild tingling in the right, she has been seen by neurology, head CT is unremarkable, due to pacemaker MRI MRA cannot be done. I have discussed her case in detail  with Dr.Bezov recommends continuation of her 81 mg of aspirin only at this time, permissive hypertension, I will order echogram, carotid duplex, PT OT speech eval. She seems to have passed a bedside speech eval by the nurse ,Lipid panel A1c will be obtained also, continue her home dyslipidemia medications.   2. Hypertension- for now her home medication will be held as per neurology recommendation when necessary clonidine only if blood pressure over 210/110 or signs of end organ  damage.    3. history of gastritis no acute issues home medications to be continued than outpatient GI followup with primary GI physician.   4. CK D. stage IV  - her creatinine appears to be at baseline, off note patient is on a ARB medication, this has to be resumed once okayed by neurology i.e. 48- 72 hour timeframe post acute neurological event.    5. Mild leukocytosis unclear etiology will obtain UA with culture and sensitivity I have obtained a stat chest x-ray which appears stable.   DVT Prophylaxis  SCDs    AM Labs Ordered, also please review Full Orders  Admission, patients condition and plan of care including tests being ordered have been discussed with the patient and son who indicate understanding and agree with the plan and Code Status.  Code Status DNI  Condition Marinell Blight K M.D on 07/02/2011 at 6:25 PM  [facility] Office  [phone]

## 2011-07-02 NOTE — Code Documentation (Signed)
Code stroke called at 1518, pt in triage and arrived at 1506.  Stroke team arrived at 1525, patient in CT at 24.  Patient was LSN at 1430 as per family when patient was in bathroom and started to have some right side numbness and weakness and slurred speech.  NIHSS 1, Patients right arm tingling cancelled as per Dr. Lyman Speller at [phone]

## 2011-07-02 NOTE — ED Notes (Signed)
Pt knows we need a urine sample but is unable at this time 

## 2011-07-02 NOTE — ED Notes (Signed)
Received bedside report from [place], [place].  Patient currently sitting up in bed; no respiratory or acute distress noted.  Family present at bedside.  Introduced self to patient and family and updated whiteboard in room.  Updated patient on plan of care; informed patient that a bed is available, report has been called, and that patient will be transported upstairs.  Informed patient that we need a urine sample; patient states that she is unable to go at this time.  Patient has no other questions or concerns at this time; will continue to monitor.

## 2011-07-02 NOTE — Consult Note (Signed)
Chief Complaint: "right arm and leg weakness"  HPI: Erica Stanton is an 78 y.o. female who was found in the bathroom slumped over. She was on the toilet when she developed a sudden right arm and leg weakness. However, on arrival to ED her NIHSS was only 1 and she only complained of tingling in her right arm and leg without any weakness. Patient had taken her ASA earlier today.   LSN: 2:30 pm tPA Given: No: NIHSS of 1. mRankin: 2  Past Medical History  Diagnosis Date  . Cancer   . Hx of hernia repair   . Dementia   . Pacemaker   . Hypertension   . Hyperlipemia    Past Surgical History  Procedure Date  . Abdominal hysterectomy    History reviewed. No pertinent family history. Social History:  does not have a smoking history on file. She does not have any smokeless tobacco history on file. She reports that she does not drink alcohol or use illicit drugs.  Allergies:  Allergies  Allergen Reactions  . Penicillins     REACTION: rash   Medications: I have reviewed the patient's current medications.  ROS: as above  Physical Examination: Blood pressure 188/94, pulse 74, temperature 98.4 F (36.9 C), temperature source Oral, resp. rate 13, SpO2 96.00%.  Neurologic Examination: MS: AAO*3, no aphasia, followed complex commands CN: EOMI, PERRL, VFF, no face asymmetry, no dysarthria, tongue midline, V1-V3 sensation of face was normal Motor: no drift, 5/5 strength throughout Sensory: no deficit to LT/PP Coord: F to N intact and symmetric b/l Reflexes: 2+ in UE, 1+ in LE, downgoing plantars Gait: deferred  Results for orders placed during the hospital encounter of 07/02/11 (from the past 48 hour(s))  GLUCOSE, CAPILLARY     Status: Abnormal   Collection Time   07/02/11  3:20 PM      Component Value Range Comment   Glucose-Capillary 122 (*) 70 - 99 (mg/dL)    Comment 1 Documented in Chart      Comment 2 Notify RN     PROTIME-INR     Status: Normal   Collection Time   07/02/11   3:26 PM      Component Value Range Comment   Prothrombin Time 13.7  11.6 - 15.2 (seconds)    INR 1.03  0.00 - 1.49    APTT     Status: Normal   Collection Time   07/02/11  3:26 PM      Component Value Range Comment   aPTT 29  24 - 37 (seconds)   CBC     Status: Abnormal   Collection Time   07/02/11  3:26 PM      Component Value Range Comment   WBC 13.8 (*) 4.0 - 10.5 (K/uL)    RBC 4.82  3.87 - 5.11 (MIL/uL)    Hemoglobin 13.1  12.0 - 15.0 (g/dL)    HCT 45.4  09.8 - 11.9 (%)    MCV 89.0  78.0 - 100.0 (fL)    MCH 27.2  26.0 - 34.0 (pg)    MCHC 30.5  30.0 - 36.0 (g/dL)    RDW 14.7  82.9 - 56.2 (%)    Platelets 309  150 - 400 (K/uL)   DIFFERENTIAL     Status: Abnormal   Collection Time   07/02/11  3:26 PM      Component Value Range Comment   Neutrophils Relative 77  43 - 77 (%)    Neutro Abs 10.6 (*)  1.7 - 7.7 (K/uL)    Lymphocytes Relative 15  12 - 46 (%)    Lymphs Abs 2.1  0.7 - 4.0 (K/uL)    Monocytes Relative 6  3 - 12 (%)    Monocytes Absolute 0.8  0.1 - 1.0 (K/uL)    Eosinophils Relative 2  0 - 5 (%)    Eosinophils Absolute 0.2  0.0 - 0.7 (K/uL)    Basophils Relative 0  0 - 1 (%)    Basophils Absolute 0.1  0.0 - 0.1 (K/uL)   COMPREHENSIVE METABOLIC PANEL     Status: Abnormal   Collection Time   07/02/11  3:26 PM      Component Value Range Comment   Sodium 140  135 - 145 (mEq/L)    Potassium 4.5  3.5 - 5.1 (mEq/L)    Chloride 107  96 - 112 (mEq/L)    CO2 22  19 - 32 (mEq/L)    Glucose, Bld 132 (*) 70 - 99 (mg/dL)    BUN 28 (*) 6 - 23 (mg/dL)    Creatinine, Ser 9.81 (*) 0.50 - 1.10 (mg/dL)    Calcium 9.3  8.4 - 10.5 (mg/dL)    Total Protein 6.7  6.0 - 8.3 (g/dL)    Albumin 3.2 (*) 3.5 - 5.2 (g/dL)    AST 20  0 - 37 (U/L)    ALT 10  0 - 35 (U/L)    Alkaline Phosphatase 70  39 - 117 (U/L)    Total Bilirubin 0.2 (*) 0.3 - 1.2 (mg/dL)    GFR calc non Af Amer 21 (*) >90 (mL/min)    GFR calc Af Amer 25 (*) >90 (mL/min)   CK TOTAL AND CKMB     Status: Normal   Collection  Time   07/02/11  3:26 PM      Component Value Range Comment   Total CK 22  7 - 177 (U/L)    CK, MB 1.9  0.3 - 4.0 (ng/mL)    Relative Index RELATIVE INDEX IS INVALID  0.0 - 2.5    TROPONIN I     Status: Normal   Collection Time   07/02/11  3:26 PM      Component Value Range Comment   Troponin I <0.30  <0.30 (ng/mL)   POCT I-STAT, CHEM 8     Status: Abnormal   Collection Time   07/02/11  3:34 PM      Component Value Range Comment   Sodium 142  135 - 145 (mEq/L)    Potassium 4.6  3.5 - 5.1 (mEq/L)    Chloride 112  96 - 112 (mEq/L)    BUN 31 (*) 6 - 23 (mg/dL)    Creatinine, Ser 1.91 (*) 0.50 - 1.10 (mg/dL)    Glucose, Bld 478 (*) 70 - 99 (mg/dL)    Calcium, Ion 2.95  1.12 - 1.32 (mmol/L)    TCO2 24  0 - 100 (mmol/L)    Hemoglobin 14.3  12.0 - 15.0 (g/dL)    HCT 62.1  30.8 - 65.7 (%)    Ct Head Wo Contrast  07/02/2011  *RADIOLOGY REPORT*  Clinical Data: Acute stroke.  Slurred speech and right-sided facial numbness.  CT HEAD WITHOUT CONTRAST  Technique:  Contiguous axial images were obtained from the base of the skull through the vertex without contrast.  Comparison: 09/29/2010  Findings: There is no evidence of intracranial hemorrhage, brain edema or other signs of acute infarction.  There is no evidence of  intracranial mass lesion or mass effect.  No abnormal extra-axial fluid collections are identified.  Mild diffuse cerebral atrophy appears stable.  Chronic small vessel disease is also unchanged. The Panda thinner infarcts are seen involving the basal ganglia bilaterally and right pons.  No evidence of hydrocephalus.  No skull abnormality identified.  IMPRESSION:  1.  No acute intracranial abnormality. 2.  Stable mild cerebral atrophy, chronic small vessel disease, and old lacunar infarcts.  Original Report Authenticated By: Danae Orleans, M.D.   Assessment: 78 y.o. female with TIA vs. CVA (unclear at this time due to persistent tingling) - possible small left thalamic CVA  Stroke Risk  Factors - hypertension  Plan: 1. HgbA1c, fasting lipid panel 2. Repeat CT head in 24 hours 3. PT consult, OT consult, Speech consult 4. Echocardiogram 5. Carotid dopplers 6. Prophylactic therapy-Antiplatelet med: Aspirin - dose 81 mg PO daily 7. Risk factor modification 8. Neurochecks q4h 9. Would keep MAP b/w 120 to 130 and would not aggressively control blood pressure unless it is over recommended parameters if she still has persistent tingling. If tingling has resolved and she is completely back to baseline, would recommend tighter control of her blood pressure 10. Stroke team will follow  Yulieth Carrender 07/02/2011, 5:03 PM

## 2011-07-02 NOTE — ED Notes (Addendum)
Neurologist at bedside. 

## 2011-07-02 NOTE — ED Notes (Signed)
Pt resting quietly at the time. She is alert and oriented x4. Denies pain. Vital signs stable. No signs of distress at the present. Family at bedside. Will continue to monitor.

## 2011-07-02 NOTE — ED Provider Notes (Signed)
History     CSN: [id number]  Arrival date & time 07/02/11  1506   First MD Initiated Contact with Patient 07/02/11 1538      Chief Complaint  Patient presents with  . Code Stroke    (Consider location/radiation/quality/duration/timing/severity/associated sxs/prior treatment) Patient is a 78 y.o. female presenting with neurologic complaint.  Neurologic Problem The primary symptoms include focal weakness and loss of sensation. Primary symptoms do not include fever, nausea or vomiting. The symptoms began less than 1 hour ago. The symptoms are improving. The neurological symptoms are focal. Context: while sitting watching tv.  Weakness began less than 1 hour ago. The weakness is improving. There is decreased muscle function with maximum physical effort.  Region/motion of weakness: right arm, right leg.  Location of loss of sensation: right arm, right leg.  Additional symptoms do not include pain. Medical issues also include hypertension. Associated medical issues comments: HLD.    Past Medical History  Diagnosis Date  . Cancer   . Hx of hernia repair   . Dementia   . Pacemaker   . Hypertension   . Hyperlipemia     Past Surgical History  Procedure Date  . Abdominal hysterectomy     History reviewed. No pertinent family history.  History  Substance Use Topics  . Smoking status: Not on file  . Smokeless tobacco: Not on file  . Alcohol Use: No    OB History    Grav Para Term Preterm Abortions TAB SAB Ect Mult Living                  Review of Systems  Constitutional: Negative for fever.  HENT: Negative for congestion.   Respiratory: Negative for cough and shortness of breath.   Cardiovascular: Negative for chest pain.  Gastrointestinal: Negative for nausea, vomiting, abdominal pain and diarrhea.  Genitourinary: Negative for difficulty urinating.  Neurological: Positive for focal weakness.  All other systems reviewed and are negative.    Allergies   Penicillins  Home Medications  No current outpatient prescriptions on file.  BP 183/65  Pulse 74  Temp(Src) 98.4 F (36.9 C) (Oral)  Resp 13  SpO2 96%  Physical Exam  Nursing note and vitals reviewed. Constitutional: She is oriented to person, place, and time. She appears well-developed and well-nourished. No distress.  HENT:  Head: Normocephalic and atraumatic.  Mouth/Throat: Oropharynx is clear and moist.  Eyes: Conjunctivae are normal. Pupils are equal, round, and reactive to light. No scleral icterus.  Neck: Neck supple.  Cardiovascular: Normal rate, regular rhythm, normal heart sounds and intact distal pulses.   No murmur heard. Pulmonary/Chest: Effort normal and breath sounds normal. No stridor. No respiratory distress. She has no rales.  Abdominal: Soft. Bowel sounds are normal. She exhibits no distension. There is no tenderness.  Musculoskeletal: Normal range of motion.  Neurological: She is alert and oriented to person, place, and time. She has normal strength. A sensory deficit (mild decreased sensation to soft touch in right arm.) is present. No cranial nerve deficit. Coordination normal. GCS eye subscore is 4. GCS verbal subscore is 5. GCS motor subscore is 6.  Skin: Skin is warm and dry. No rash noted.  Psychiatric: She has a normal mood and affect. Her behavior is normal.    ED Course  Procedures (including critical care time)  Labs Reviewed  GLUCOSE, CAPILLARY - Abnormal; Notable for the following:    Glucose-Capillary 122 (*)    All other components within normal limits  CBC -  Abnormal; Notable for the following:    WBC 13.8 (*)    All other components within normal limits  DIFFERENTIAL - Abnormal; Notable for the following:    Neutro Abs 10.6 (*)    All other components within normal limits  COMPREHENSIVE METABOLIC PANEL - Abnormal; Notable for the following:    Glucose, Bld 132 (*)    BUN 28 (*)    Creatinine, Ser 2.07 (*)    Albumin 3.2 (*)     Total Bilirubin 0.2 (*)    GFR calc non Af Amer 21 (*)    GFR calc Af Amer 25 (*)    All other components within normal limits  POCT I-STAT, CHEM 8 - Abnormal; Notable for the following:    BUN 31 (*)    Creatinine, Ser 2.00 (*)    Glucose, Bld 133 (*)    All other components within normal limits  URINALYSIS, ROUTINE W REFLEX MICROSCOPIC - Abnormal; Notable for the following:    APPearance CLOUDY (*)    Glucose, UA 100 (*)    Hgb urine dipstick SMALL (*)    Protein, ur 100 (*)    All other components within normal limits  URINE MICROSCOPIC-ADD ON - Abnormal; Notable for the following:    Squamous Epithelial / LPF MANY (*)    Bacteria, UA FEW (*)    Casts HYALINE CASTS (*)    All other components within normal limits  PROTIME-INR  APTT  CK TOTAL AND CKMB  TROPONIN I  URINE CULTURE  LIPID PANEL  BASIC METABOLIC PANEL  CBC  HEMOGLOBIN A1C  PROTIME-INR  TSH   Ct Head Wo Contrast  07/02/2011  *RADIOLOGY REPORT*  Clinical Data: Acute stroke.  Slurred speech and right-sided facial numbness.  CT HEAD WITHOUT CONTRAST  Technique:  Contiguous axial images were obtained from the base of the skull through the vertex without contrast.  Comparison: 09/29/2010  Findings: There is no evidence of intracranial hemorrhage, brain edema or other signs of acute infarction.  There is no evidence of intracranial mass lesion or mass effect.  No abnormal extra-axial fluid collections are identified.  Mild diffuse cerebral atrophy appears stable.  Chronic small vessel disease is also unchanged. The Panda thinner infarcts are seen involving the basal ganglia bilaterally and right pons.  No evidence of hydrocephalus.  No skull abnormality identified.  IMPRESSION:  1.  No acute intracranial abnormality. 2.  Stable mild cerebral atrophy, chronic small vessel disease, and old lacunar infarcts.  Original Report Authenticated By: Danae Orleans, M.D.     1. Right sided weakness   2. Tingling       MDM  78 yo  female who presented as a code stroke secondary to acute right arm and leg weakness.  On initial exam, right weakness had resolved, and she complained only of persistent tingling in her right arm.  CT head negative for acute abnormality.  Neurology recommended no interventional treatment, but inpatient monitoring.  Admitted to [facility].          Warnell Forester, MD 07/03/11 0100

## 2011-07-03 ENCOUNTER — Inpatient Hospital Stay (HOSPITAL_COMMUNITY): Payer: Medicare Other

## 2011-07-03 LAB — BASIC METABOLIC PANEL
CO2: 22 mEq/L (ref 19–32)
Calcium: 9.1 mg/dL (ref 8.4–10.5)
Creatinine, Ser: 2.05 mg/dL — ABNORMAL HIGH (ref 0.50–1.10)
Glucose, Bld: 96 mg/dL (ref 70–99)

## 2011-07-03 LAB — CBC
MCH: 26.5 pg (ref 26.0–34.0)
MCV: 87.7 fL (ref 78.0–100.0)
Platelets: 273 10*3/uL (ref 150–400)
RDW: 14.1 % (ref 11.5–15.5)
WBC: 11.6 10*3/uL — ABNORMAL HIGH (ref 4.0–10.5)

## 2011-07-03 LAB — LIPID PANEL
HDL: 32 mg/dL — ABNORMAL LOW (ref >39)
LDL Cholesterol: 93 mg/dL (ref 0–99)
Triglycerides: 195 mg/dL — ABNORMAL HIGH (ref <150)

## 2011-07-03 MED ORDER — CLOPIDOGREL BISULFATE 75 MG PO TABS
75.0000 mg | ORAL_TABLET | Freq: Every day | ORAL | Status: DC
  Administered 2011-07-04: 75 mg via ORAL
  Filled 2011-07-03: qty 1

## 2011-07-03 MED ORDER — ASPIRIN 81 MG PO CHEW
81.0000 mg | CHEWABLE_TABLET | Freq: Every day | ORAL | Status: DC
  Administered 2011-07-03: 81 mg via ORAL
  Filled 2011-07-03: qty 1

## 2011-07-03 MED ORDER — PANTOPRAZOLE SODIUM 40 MG PO TBEC
40.0000 mg | DELAYED_RELEASE_TABLET | Freq: Every day | ORAL | Status: DC
  Administered 2011-07-03 – 2011-07-04 (×2): 40 mg via ORAL
  Filled 2011-07-03 (×2): qty 1

## 2011-07-03 NOTE — Progress Notes (Signed)
Subjective: Patient still complaining of some tingling and weakness in RUE.  Objective: Vital signs in last 24 hours: Filed Vitals:   07/03/11 0200 07/03/11 0354 07/03/11 0552 07/03/11 0807  BP: 142/80 113/74 129/76 166/84  Pulse: 75 71 75 84  Temp: 98.5 F (36.9 C) 98.2 F (36.8 C) 98.5 F (36.9 C) 95.7 F (35.4 C)  TempSrc: Oral Oral Oral Oral  Resp: 20 20 18 18   Height:      Weight:      SpO2: 96% 94% 94% 95%   No intake or output data in the 24 hours ending 07/03/11 0923  Weight change:   General: Alert, awake, oriented x3, in no acute distress. Heart: Regular rate and rhythm, without murmurs, rubs, gallops. Lungs: Clear to auscultation bilaterally. Abdomen: Soft, nontender, nondistended, positive bowel sounds. Extremities: No clubbing cyanosis or edema with positive pedal pulses. Neuro:  Min-Mild weakness RUE > LUE, CN2-12 grossly intact.    Lab Results:  Basename 07/03/11 0520 07/02/11 1534 07/02/11 1526  NA 137 142 --  K 3.9 4.6 --  CL 107 112 --  CO2 22 -- 22  GLUCOSE 96 133* --  BUN 27* 31* --  CREATININE 2.05* 2.00* --  CALCIUM 9.1 -- 9.3  MG -- -- --  PHOS -- -- --    Basename 07/02/11 1526  AST 20  ALT 10  ALKPHOS 70  BILITOT 0.2*  PROT 6.7  ALBUMIN 3.2*   No results found for this basename: LIPASE:2,AMYLASE:2 in the last 72 hours  Basename 07/03/11 0520 07/02/11 1534 07/02/11 1526  WBC 11.6* -- 13.8*  NEUTROABS -- -- 10.6*  HGB 12.1 14.3 --  HCT 40.0 42.0 --  MCV 87.7 -- 89.0  PLT 273 -- 309    Basename 07/02/11 1526  CKTOTAL 22  CKMB 1.9  CKMBINDEX --  TROPONINI <0.30   No components found with this basename: POCBNP:3 No results found for this basename: DDIMER:2 in the last 72 hours No results found for this basename: HGBA1C:2 in the last 72 hours  Basename 07/03/11 0520  CHOL 164  HDL 32*  LDLCALC 93  TRIG 409*  CHOLHDL 5.1  LDLDIRECT --   No results found for this basename: TSH,T4TOTAL,FREET3,T3FREE,THYROIDAB in the  last 72 hours No results found for this basename: VITAMINB12:2,FOLATE:2,FERRITIN:2,TIBC:2,IRON:2,RETICCTPCT:2 in the last 72 hours  Micro Results: No results found for this or any previous visit (from the past 240 hour(s)).  Studies/Results: Ct Head Wo Contrast  07/02/2011  *RADIOLOGY REPORT*  Clinical Data: Acute stroke.  Slurred speech and right-sided facial numbness.  CT HEAD WITHOUT CONTRAST  Technique:  Contiguous axial images were obtained from the base of the skull through the vertex without contrast.  Comparison: 09/29/2010  Findings: There is no evidence of intracranial hemorrhage, brain edema or other signs of acute infarction.  There is no evidence of intracranial mass lesion or mass effect.  No abnormal extra-axial fluid collections are identified.  Mild diffuse cerebral atrophy appears stable.  Chronic small vessel disease is also unchanged. The Panda thinner infarcts are seen involving the basal ganglia bilaterally and right pons.  No evidence of hydrocephalus.  No skull abnormality identified.  IMPRESSION:  1.  No acute intracranial abnormality. 2.  Stable mild cerebral atrophy, chronic small vessel disease, and old lacunar infarcts.  Original Report Authenticated By: Danae Orleans, M.D.   Dg Chest Port 1 View  07/02/2011  *RADIOLOGY REPORT*  Clinical Data: Cough.  PORTABLE CHEST - 1 VIEW  Comparison: 09/29/2010  Findings: Cardiomegaly and tortuosity of the thoracic aorta are stable.  Both lungs are clear.  No evidence of pleural effusion. Dual lead transvenous pacemaker remains in stable position.  IMPRESSION: Stable cardiomegaly.  No acute findings.  Original Report Authenticated By: Danae Orleans, M.D.    Medications:     . amitriptyline  10 mg Oral BID  . digoxin  125 mcg Oral Daily  . donepezil  10 mg Oral QHS  . fenofibrate  54 mg Oral Daily  . simvastatin  20 mg Oral q1800  . sodium chloride  3 mL Intravenous Q12H  . DISCONTD: fenofibrate  54 mg Oral Daily     Assessment: Principal Problem:  *TIA (transient ischemic attack) Active Problems:  GERD  GASTRITIS  Gastroparesis  WEIGHT LOSS  Abdominal pain, epigastric  ADENOCARCINOMA, COLON, HX OF  CKD (chronic kidney disease) stage 4, GFR 15-29 ml/min   Plan:  #1. TIA vs CVA Patient still with some tingling and weakness in RUE. Carotid dopplers pending. 2decho pending, repeat CT head pending. LDL = 93. Continue ASA, ?? Changing to plavix vs agreenox as patient was on ASA at home, will defer to neurology. Continue statin, digoxin,fenofibrate. Will resume ARB in 1-2 days. Neurology ff and appreciate input and rxcs.  #2. HTN Digoxin. Will resume ARB in 1-2 days.  #3.CKD stage 4 Stable.  #4. Leukocytosis Likely reactive. Slowly trending down. UA neg. CXR neg.  #5. Hx of gastritis/GERD PPI  LOS: 1 day   Kennen Stammer 07/03/2011, 9:23 AM

## 2011-07-03 NOTE — Progress Notes (Signed)
Stroke Team Progress Note  HISTORY Erica Stanton is an 78 y.o. female who was found in the bathroom slumped over. She was on the toilet when she developed a sudden right arm and leg weakness. However, on arrival to ED her NIHSS was only 1 and she only complained of tingling in her right arm and leg without any weakness. Patient had taken her ASA earlier today. Level of Consciousness (1a. ): Alert, keenly responsive (02/03 0813) LOC Questions (1b. ): Answers both questions correctly (02/03 0813) LOC Commands (1c. ): Performs both tasks correctly (02/03 0813) Best Gaze (2. ): Normal (02/03 0813) Visual (3. ): No visual loss (02/03 0813) Facial Palsy (4. ): Normal symmetrical movements (02/03 0813) Motor Arm, Left (5a. ): Drift (02/03 0813) Motor Arm, Right (5b. ): No drift (02/03 0813) Motor Leg, Left (6a. ): No drift (02/03 0813) Motor Leg, Right (6b. ): No drift (02/03 0813) Limb Ataxia (7. ): Absent (02/03 0813) Sensory (8. ): Normal, no sensory loss (02/03 0813) Best Language (9. ): No aphasia (02/03 0813) Dysarthria (10. ): Normal (02/03 0813) Inattention/Extinction: No Abnormality (02/03 0813) Total: 1  (02/03 0813)  SUBJECTIVE Feels ok.  Still a little tingling in RUE.  Mild weakness persists.  R hand dominant.  OBJECTIVE Filed Vitals:   07/03/11 0200 07/03/11 0354 07/03/11 0552 07/03/11 0807  BP: 142/80 113/74 129/76 166/84  Pulse: 75 71 75 84  Temp: 98.5 F (36.9 C) 98.2 F (36.8 C) 98.5 F (36.9 C) 95.7 F (35.4 C)  TempSrc: Oral Oral Oral Oral  Resp: 20 20 18 18   Height:      Weight:      SpO2: 96% 94% 94% 95%    CBG (last 3)  Basename 07/02/11 1520  GLUCAP 122*     IV Fluid Intake:    Medications    . amitriptyline  10 mg Oral BID  . digoxin  125 mcg Oral Daily  . donepezil  10 mg Oral QHS  . fenofibrate  54 mg Oral Daily  . simvastatin  20 mg Oral q1800  . sodium chloride  3 mL Intravenous Q12H  . DISCONTD: fenofibrate  54 mg Oral Daily  PRN  albuterol, cloNIDine, guaiFENesin-dextromethorphan, HYDROcodone-acetaminophen, ondansetron (ZOFRAN) IV, ondansetron  Diet:  Cardiac thin liquids Activity:  Up with assistance DVT Prophylaxis: SCD's  Mental Status: Alert, oriented, thought content appropriate.  Speech fluent without evidence of aphasia. Able to follow 3 step commands without difficulty. Cranial Nerves: II- Visual fields grossly intact. III/IV/VI-Extraocular movements intact.  Pupils reactive bilaterally. V/VII-Smile symmetric VIII-grossly intact IX/X-normal gag XI-bilateral shoulder shrug XII-midline tongue extension Motor: 5/5 bilaterally with normal tone and bulk Sensory: Pinprick and light touch intact, slightly diminished on the RUE. Deep Tendon Reflexes: 1+ and symmetric throughout Plantars: Downgoing bilaterally Cerebellar: Normal finger-to-nose, normal rapid alternating movements and normal heel-to-shin test.   Significant Diagnostic Studies: CBC    Component Value Date/Time   WBC 11.6* 07/03/2011 0520   RBC 4.56 07/03/2011 0520   HGB 12.1 07/03/2011 0520   HCT 40.0 07/03/2011 0520   PLT 273 07/03/2011 0520   MCV 87.7 07/03/2011 0520   MCH 26.5 07/03/2011 0520   MCHC 30.3 07/03/2011 0520   RDW 14.1 07/03/2011 0520   LYMPHSABS 2.1 07/02/2011 1526   MONOABS 0.8 07/02/2011 1526   EOSABS 0.2 07/02/2011 1526   BASOSABS 0.1 07/02/2011 1526   CMP    Component Value Date/Time   NA 137 07/03/2011 0520   K 3.9 07/03/2011 0520  CL 107 07/03/2011 0520   CO2 22 07/03/2011 0520   GLUCOSE 96 07/03/2011 0520   BUN 27* 07/03/2011 0520   CREATININE 2.05* 07/03/2011 0520   CALCIUM 9.1 07/03/2011 0520   PROT 6.7 07/02/2011 1526   ALBUMIN 3.2* 07/02/2011 1526   AST 20 07/02/2011 1526   ALT 10 07/02/2011 1526   ALKPHOS 70 07/02/2011 1526   BILITOT 0.2* 07/02/2011 1526   GFRNONAA 22* 07/03/2011 0520   GFRAA 25* 07/03/2011 0520   COAGS Lab Results  Component Value Date   INR 1.14 07/03/2011   INR 1.03 07/02/2011   Lipid Panel    Component Value Date/Time    CHOL 164 07/03/2011 0520   TRIG 195* 07/03/2011 0520   HDL 32* 07/03/2011 0520   CHOLHDL 5.1 07/03/2011 0520   VLDL 39 07/03/2011 0520   LDLCALC 93 07/03/2011 0520   Ct Head Wo Contrast  07/02/2011.  CT HEAD WITHOUT CONTRAST    Findings: There is no evidence of intracranial hemorrhage, brain edema or other signs of acute infarction.  There is no evidence of intracranial mass lesion or mass effect.  No abnormal extra-axial fluid collections are identified.  Mild diffuse cerebral atrophy appears stable.  Chronic small vessel disease is also unchanged. The Panda thinner infarcts are seen involving the basal ganglia bilaterally and right pons.  No evidence of hydrocephalus.  No skull abnormality identified.  IMPRESSION:  1.  No acute intracranial abnormality. 2.  Stable mild cerebral atrophy, chronic small vessel disease, and old lacunar infarcts. Danae Orleans, M.D.   07/02/2011  PORTABLE CHEST - 1 VIEW   Findings: Cardiomegaly and tortuosity of the thoracic aorta are stable.  Both lungs are clear.  No evidence of pleural effusion. Dual lead transvenous pacemaker remains in stable position.  IMPRESSION: Stable cardiomegaly.  No acute findings.  Original Report  Danae Orleans, M.D.   ASSESSMENT Ms. Erica Stanton is a 78 y.o. female with 1. R UE tingling- CT neg.  Repeat CT pending.  possible small left thalamic CVA while on ASA 81 mg QD. 2. CRF 3. Hyperlipidemia-LDL 63 4. Hypertension- keep MAP b/w 120 to 130   Stroke risk factors:  HTN, hyperlipidemia  Hospital day # 1  TREATMENT/PLAN 1. Plavix for secondary stroke prevention.  2. Repeat head CT pending. 3. Echo, dopplers and A1C pending.  Marya Fossa PA-C [facility] [phone] 07/03/11 0850  Lesly Dukes

## 2011-07-03 NOTE — ED Provider Notes (Signed)
I saw and evaluated the patient, reviewed the resident's note and I agree with the findings and plan. Patient presented as acute stroke with acute right arm and leg weakness however on his initial exam symptoms have resolved. CT of the head was unremarkable for acute abnormality. Neurology at bedside and felt that the patient would be best served on the hospitalist service.  Gwyneth Sprout, MD 07/03/11 1511

## 2011-07-03 NOTE — Progress Notes (Signed)
Physical Therapy Evaluation Patient Details Name: Erica Stanton MRN: [id number] DOB: Jan 20, 1928 Today's Date: 07/03/2011  Problem List:  Patient Active Problem List  Diagnoses  . GERD  . GASTRITIS  . Gastroparesis  . WEIGHT LOSS  . Abdominal pain, epigastric  . ADENOCARCINOMA, COLON, HX OF  . TIA (transient ischemic attack)  . CKD (chronic kidney disease) stage 4, GFR 15-29 ml/min    Past Medical History:  Past Medical History  Diagnosis Date  . Cancer   . Hx of hernia repair   . Dementia   . Pacemaker   . Hypertension   . Hyperlipemia   . CKD (chronic kidney disease) stage 4, GFR 15-29 ml/min 07/02/2011  . TIA (transient ischemic attack) 07/02/2011   Past Surgical History:  Past Surgical History  Procedure Date  . Abdominal hysterectomy     PT Assessment/Plan/Recommendation PT Assessment Clinical Impression Statement: Pt presents with a medical diagnosis of TIA along with decreased balance leading to instability during gait. Pt will benefit from skilled PT in the acute care setting in order to maximize functional mobility and safety prior to d/c. Recommend reassess gait with RW for stability during ambulation PT Recommendation/Assessment: Patient will need skilled PT in the acute care venue PT Problem List: Decreased activity tolerance;Decreased balance;Decreased mobility;Decreased knowledge of use of DME PT Therapy Diagnosis : Abnormality of gait PT Plan PT Frequency: Min 4X/week PT Treatment/Interventions: DME instruction;Gait training;Functional mobility training;Therapeutic activities;Balance training;Therapeutic exercise;Neuromuscular re-education;Patient/family education PT Recommendation Follow Up Recommendations: Home health PT;Supervision/Assistance - 24 hour Equipment Recommended: None recommended by PT PT Goals  Acute Rehab PT Goals PT Goal Formulation: With patient Time For Goal Achievement: 7 days Pt will go Sit to Stand: with modified independence PT  Goal: Sit to Stand - Progress: Goal set today Pt will go Stand to Sit: with modified independence PT Goal: Stand to Sit - Progress: Goal set today Pt will Transfer Bed to Chair/Chair to Bed: with modified independence PT Transfer Goal: Bed to Chair/Chair to Bed - Progress: Goal set today Pt will Ambulate: with supervision;>150 feet;with least restrictive assistive device PT Goal: Ambulate - Progress: Goal set today Additional Goals Additional Goal #1: Pt will be able to maintain standing with feet together for >5 seconds PT Goal: Additional Goal #1 - Progress: Goal set today  PT Evaluation Precautions/Restrictions  Restrictions Weight Bearing Restrictions: No Prior Functioning  Home Living Lives With: Family Receives Help From: Family Type of Home: House Home Layout: One level Home Access: Level entry Bathroom Shower/Tub: Tub/shower unit;Curtain Firefighter: Standard Bathroom Accessibility: Yes How Accessible: Accessible via walker Home Adaptive Equipment: Straight cane;Walker - rolling Prior Function Level of Independence: Independent with basic ADLs;Independent with homemaking with ambulation;Independent with gait;Independent with transfers (ambulates with cane) Able to Take Stairs?: No Driving: No Vocation: Retired Producer, television/film/video: Awake/alert Overall Cognitive Status: Appears within functional limits for tasks assessed Orientation Level: Oriented X4 Sensation/Coordination Sensation Light Touch: Appears Intact Extremity Assessment RLE Assessment RLE Assessment: Within Functional Limits LLE Assessment LLE Assessment: Within Functional Limits Mobility (including Balance) Bed Mobility Bed Mobility: Yes Supine to Sit: 6: Modified independent (Device/Increase time) Sitting - Scoot to Edge of Bed: 6: Modified independent (Device/Increase time) Sit to Supine: 6: Modified independent (Device/Increase time) Transfers Transfers: Yes Sit to Stand:  4: Min assist;With upper extremity assist;From bed Sit to Stand Details (indicate cue type and reason): VC for hand placement and sequencing. Min assist with forward translation and stability Stand to Sit: 4: Min assist;With upper extremity  assist;To bed Stand to Sit Details: VC rfor hand palcement and sequencing Ambulation/Gait Ambulation/Gait: Yes Ambulation/Gait Assistance: 4: Min assist Ambulation/Gait Assistance Details (indicate cue type and reason): Pt ambulated with IV pole for stability and min assist. Pt with slight limping towards the right side as well as narrow base of support. VC throughout for proper technique and safety Ambulation Distance (Feet): 75 Feet Assistive device: Other (Comment) (IV pole) Gait Pattern: Step-to pattern;Decreased weight shift to left;Decreased stride length;Decreased hip/knee flexion - right (narrow BOS) Gait velocity: Decreased cadence Stairs: No  Balance Balance Assessed: Yes Static Standing Balance Single Leg Stance - Right Leg: 3  Single Leg Stance - Left Leg: 4  Rhomberg - Eyes Opened: 0  (unable to maintain stability with feet together) Exercise    End of Session PT - End of Session Equipment Utilized During Treatment: Gait belt Activity Tolerance: Patient tolerated treatment well Patient left: in bed;with call bell in reach Nurse Communication: Mobility status for transfers;Mobility status for ambulation General Behavior During Session: [facility] for tasks performed Cognition: [facility] for tasks performed  Milana Kidney 07/03/2011, 11:31 AM  07/03/2011 Milana Kidney DPT PAGER: [phone] OFFICE: [phone]

## 2011-07-04 DIAGNOSIS — F0390 Unspecified dementia without behavioral disturbance: Secondary | ICD-10-CM | POA: Diagnosis present

## 2011-07-04 LAB — BASIC METABOLIC PANEL
CO2: 21 mEq/L (ref 19–32)
Chloride: 110 mEq/L (ref 96–112)
Glucose, Bld: 105 mg/dL — ABNORMAL HIGH (ref 70–99)
Potassium: 3.9 mEq/L (ref 3.5–5.1)
Sodium: 140 mEq/L (ref 135–145)

## 2011-07-04 LAB — URINE CULTURE: Colony Count: 75000

## 2011-07-04 LAB — CBC
HCT: 40.5 % (ref 36.0–46.0)
Hemoglobin: 12.4 g/dL (ref 12.0–15.0)
MCH: 27.1 pg (ref 26.0–34.0)
MCV: 88.6 fL (ref 78.0–100.0)
RBC: 4.57 MIL/uL (ref 3.87–5.11)
WBC: 10.7 10*3/uL — ABNORMAL HIGH (ref 4.0–10.5)

## 2011-07-04 MED ORDER — METOPROLOL TARTRATE 25 MG PO TABS
25.0000 mg | ORAL_TABLET | Freq: Two times a day (BID) | ORAL | Status: DC
  Administered 2011-07-04: 25 mg via ORAL
  Filled 2011-07-04 (×2): qty 1

## 2011-07-04 MED ORDER — CLOPIDOGREL BISULFATE 75 MG PO TABS: 75.0000 mg | ORAL_TABLET | Freq: Every day | ORAL | Status: AC

## 2011-07-04 MED ORDER — SODIUM CHLORIDE 0.9 % IV SOLN: INTRAVENOUS | Status: DC

## 2011-07-04 NOTE — Evaluation (Signed)
Occupational Therapy Evaluation Patient Details Name: Erica Stanton MRN: [id number] DOB: 01/30/1928 Today's Date: 07/04/2011  Problem List:  Patient Active Problem List  Diagnoses  . GERD  . GASTRITIS  . Gastroparesis  . WEIGHT LOSS  . Abdominal pain, epigastric  . ADENOCARCINOMA, COLON, HX OF  . TIA (transient ischemic attack)  . CKD (chronic kidney disease) stage 4, GFR 15-29 ml/min  . Dementia    Past Medical History:  Past Medical History  Diagnosis Date  . Cancer   . Hx of hernia repair   . Dementia   . Pacemaker   . Hypertension   . Hyperlipemia   . CKD (chronic kidney disease) stage 4, GFR 15-29 ml/min 07/02/2011  . TIA (transient ischemic attack) 07/02/2011   Past Surgical History:  Past Surgical History  Procedure Date  . Abdominal hysterectomy     OT Assessment/Plan/Recommendation OT Assessment Clinical Impression Statement: 78 yo female s/p TIA with balance deficits. Pt could benefit from skilled OT at d/c home. OT Recommendation/Assessment: Patient does not need any further OT services OT Recommendation Follow Up Recommendations: Home health OT Equipment Recommended: None recommended by OT;Defer to next venue OT Goals    OT Evaluation Precautions/Restrictions  Precautions Precautions: Fall Restrictions Weight Bearing Restrictions: No Prior Functioning Home Living Lives With: Family Receives Help From: Family Type of Home: House Home Layout: One level Home Access: Level entry Bathroom Shower/Tub: Tub/shower unit;Curtain Firefighter: Standard Bathroom Accessibility: Yes How Accessible: Accessible via walker Home Adaptive Equipment: Straight cane;Walker - rolling Prior Function Level of Independence: Independent with basic ADLs;Independent with homemaking with ambulation;Independent with gait;Independent with transfers Able to Take Stairs?: No Driving: No Vocation: Retired ADL ADL Eating/Feeding: Modified independent;Simulated Where  Assessed - Eating/Feeding: Edge of bed Grooming: Simulated;Wash/dry face;Wash/dry hands;Supervision/safety Where Assessed - Grooming: Standing at sink Toilet Transfer: Mining engineer Method: Proofreader: Raised toilet seat with arms (or 3-in-1 over toilet) Tub/Shower Transfer: Performed;Maximal assistance Tub/Shower Transfer Method: Ambulating Equipment Used: Rolling walker Ambulation Related to ADLs: ambulating Min A hand held (A)- RW use Min A  ADL Comments: Pt dressed on arrival. pt required extensive assistance with tub transfer. Pt and family educated on RW use and (A) for bathing at d/c. Home health to address DME needs Vision/Perception  Vision - History Baseline Vision: No visual deficits Patient Visual Report: No change from baseline Cognition Cognition Arousal/Alertness: Awake/alert Overall Cognitive Status: Appears within functional limits for tasks assessed Orientation Level: Oriented X4 Sensation/Coordination Sensation Light Touch: Appears Intact Coordination Gross Motor Movements are Fluid and Coordinated: Yes Fine Motor Movements are Fluid and Coordinated: Yes Extremity Assessment RUE Assessment RUE Assessment: Within Functional Limits LUE Assessment LUE Assessment: Within Functional Limits Mobility  Bed Mobility Bed Mobility: Yes Supine to Sit: 6: Modified independent (Device/Increase time) Sitting - Scoot to Edge of Bed: 6: Modified independent (Device/Increase time) Transfers Transfers: Yes Sit to Stand: 4: Min assist;With upper extremity assist;From bed Sit to Stand Details (indicate cue type and reason): Pt wanting therapist to (A) on Lt side. Pt with Rt side weakness. Pt demonstrating decreased awareness of Rt side deficits. Stand to Sit: 4: Min assist;With upper extremity assist;To bed Exercises   End of Session OT - End of Session Equipment Utilized During Treatment: Gait belt Activity  Tolerance: Patient tolerated treatment well Patient left: in bed;with family/visitor present;with call bell in reach Nurse Communication: Mobility status for transfers General Behavior During Session: [facility] for tasks performed Cognition: [facility] for tasks performed  Harrel Carina [facility] 07/04/2011, 2:15 PM  Pager: [phone]

## 2011-07-04 NOTE — Progress Notes (Signed)
Utilization review complete 

## 2011-07-04 NOTE — Progress Notes (Signed)
Subjective: Patient states tingling and weakness in RUE improving.  Objective: Vital signs in last 24 hours: Filed Vitals:   07/03/11 2157 07/04/11 0200 07/04/11 0500 07/04/11 0600  BP: 157/81 149/66  152/79  Pulse: 82 75  63  Temp: 97.7 F (36.5 C) 98 F (36.7 C)  98 F (36.7 C)  TempSrc:      Resp: 16 16  16   Height:      Weight:   70.2 kg (154 lb 12.2 oz) 72.4 kg (159 lb 9.8 oz)  SpO2: 92% 93%  94%    Intake/Output Summary (Last 24 hours) at 07/04/11 1000 Last data filed at 07/04/11 0810  Gross per 24 hour  Intake    360 ml  Output    400 ml  Net    -40 ml    Weight change: 0.6 kg (1 lb 5.2 oz)  General: Alert, awake, oriented x3, in no acute distress. Heart: Regular rate and rhythm, without murmurs, rubs, gallops. Lungs: Clear to auscultation bilaterally. Abdomen: Soft, nontender, nondistended, positive bowel sounds. Extremities: No clubbing cyanosis or edema with positive pedal pulses. Neuro:  Min-Mild weakness RUE > LUE, CN2-12 grossly intact.    Lab Results:  Basename 07/04/11 0500 07/03/11 0520  NA 140 137  K 3.9 3.9  CL 110 107  CO2 21 22  GLUCOSE 105* 96  BUN 34* 27*  CREATININE 2.22* 2.05*  CALCIUM 8.9 9.1  MG -- --  PHOS -- --    Basename 07/02/11 1526  AST 20  ALT 10  ALKPHOS 70  BILITOT 0.2*  PROT 6.7  ALBUMIN 3.2*   No results found for this basename: LIPASE:2,AMYLASE:2 in the last 72 hours  Basename 07/04/11 0500 07/03/11 0520 07/02/11 1526  WBC 10.7* 11.6* --  NEUTROABS -- -- 10.6*  HGB 12.4 12.1 --  HCT 40.5 40.0 --  MCV 88.6 87.7 --  PLT 279 273 --    Basename 07/02/11 1526  CKTOTAL 22  CKMB 1.9  CKMBINDEX --  TROPONINI <0.30   No components found with this basename: POCBNP:3 No results found for this basename: DDIMER:2 in the last 72 hours  Basename 07/03/11 0520  HGBA1C 5.6    Basename 07/03/11 0520  CHOL 164  HDL 32*  LDLCALC 93  TRIG 161*  CHOLHDL 5.1  LDLDIRECT --    Basename 07/03/11 0520  TSH  1.746  T4TOTAL --  T3FREE --  THYROIDAB --   No results found for this basename: VITAMINB12:2,FOLATE:2,FERRITIN:2,TIBC:2,IRON:2,RETICCTPCT:2 in the last 72 hours  Micro Results: Recent Results (from the past 240 hour(s))  URINE CULTURE     Status: Normal   Collection Time   07/02/11  9:00 PM      Component Value Range Status Comment   Specimen Description URINE, CLEAN CATCH   Final    Special Requests NONE   Final    Culture  Setup Time [id number]   Final    Colony Count 75,000 COLONIES/ML   Final    Culture     Final    Value: Multiple bacterial morphotypes present, none predominant. Suggest appropriate recollection if clinically indicated.   Report Status 07/04/2011 FINAL   Final     Studies/Results: Ct Head Wo Contrast  07/03/2011  *RADIOLOGY REPORT*  Clinical Data: Question CVA.  Code stroke yesterday.  24 are follow- up.  CT HEAD WITHOUT CONTRAST  Technique:  Contiguous axial images were obtained from the base of the skull through the vertex without contrast.  Comparison: CT head  without contrast 07/02/2011.  Findings: Extensive periventricular and subcortical white matter hypoattenuation is stable.  Remote lacunar infarcts of the basal ganglia are stable bilaterally.  No acute cortical infarct, hemorrhage, or mass lesion is present.  Generalized atrophy is similar to the prior exam.  There is a remote infarct of the right pons.  The paranasal sinuses and mastoid air cells are clear.  The osseous skull is intact.  Atherosclerotic calcifications are present within the cavernous carotid arteries bilaterally.  IMPRESSION:  1.  Stable atrophy and diffuse white matter disease.  This likely reflects the sequelae of chronic microvascular ischemia. 2.  Remote lacunar infarcts of the basal ganglia and brainstem. 3.  No acute intracranial abnormality or significant interval change.  Original Report Authenticated By: Jamesetta Orleans. MATTERN, M.D.   Ct Head Wo Contrast  07/02/2011  *RADIOLOGY  REPORT*  Clinical Data: Acute stroke.  Slurred speech and right-sided facial numbness.  CT HEAD WITHOUT CONTRAST  Technique:  Contiguous axial images were obtained from the base of the skull through the vertex without contrast.  Comparison: 09/29/2010  Findings: There is no evidence of intracranial hemorrhage, brain edema or other signs of acute infarction.  There is no evidence of intracranial mass lesion or mass effect.  No abnormal extra-axial fluid collections are identified.  Mild diffuse cerebral atrophy appears stable.  Chronic small vessel disease is also unchanged. The Panda thinner infarcts are seen involving the basal ganglia bilaterally and right pons.  No evidence of hydrocephalus.  No skull abnormality identified.  IMPRESSION:  1.  No acute intracranial abnormality. 2.  Stable mild cerebral atrophy, chronic small vessel disease, and old lacunar infarcts.  Original Report Authenticated By: Danae Orleans, M.D.   Dg Chest Port 1 View  07/02/2011  *RADIOLOGY REPORT*  Clinical Data: Cough.  PORTABLE CHEST - 1 VIEW  Comparison: 09/29/2010  Findings: Cardiomegaly and tortuosity of the thoracic aorta are stable.  Both lungs are clear.  No evidence of pleural effusion. Dual lead transvenous pacemaker remains in stable position.  IMPRESSION: Stable cardiomegaly.  No acute findings.  Original Report Authenticated By: Danae Orleans, M.D.    Medications:     . amitriptyline  10 mg Oral BID  . clopidogrel  75 mg Oral Q breakfast  . digoxin  125 mcg Oral Daily  . donepezil  10 mg Oral QHS  . fenofibrate  54 mg Oral Daily  . metoprolol tartrate  25 mg Oral BID  . pantoprazole  40 mg Oral Q0600  . simvastatin  20 mg Oral q1800  . sodium chloride  3 mL Intravenous Q12H  . DISCONTD: aspirin  81 mg Oral Daily    Assessment: Principal Problem:  *TIA (transient ischemic attack) Active Problems:  GERD  GASTRITIS  Gastroparesis  WEIGHT LOSS  Abdominal pain, epigastric  ADENOCARCINOMA, COLON, HX  OF  CKD (chronic kidney disease) stage 4, GFR 15-29 ml/min   Plan:  #1. TIA vs CVA Patient still with some tingling and weakness in RUE however improving. Carotid dopplers pending. 2decho pending, repeat CT head negative for acute abnormalities. LDL = 93.ASA changed to plavix per neurology.  Continue statin, digoxin,fenofibrate. Will resume 1/2 home dose metoprolol. PT/OT. Neurology ff and appreciate input and rxcs.  #2. HTN Digoxin. Will resume 1/2 home dose metoprolol. Follow.  #3.CKD stage 4 Stable.  #4. Leukocytosis Likely reactive. Slowly trending down. UA neg. CXR neg.  #5. Hx of gastritis/GERD PPI  LOS: 2 days   Erica Stanton 07/04/2011, 10:00 AM

## 2011-07-04 NOTE — Progress Notes (Signed)
Pt dc to home with HHPT and 24hr supervision with family. All medications f/u appointments reviewed. All queeries answered. Elmer Sow, RN, MSN

## 2011-07-04 NOTE — Progress Notes (Signed)
Stroke Team Progress Note  HISTORY Erica Stanton is an 78 y.o. female who was found in the bathroom slumped over2/07/2011. She was on the toilet when she developed a sudden right arm and leg weakness. However, on arrival to ED her NIHSS was only 1 and she only complained of tingling in her right arm and leg without any weakness. Patient had taken her ASA earlier today.  SUBJECTIVE She feels she has recovered  And wants to go home. No new complaints.  OBJECTIVE Filed Vitals:   07/04/11 0200 07/04/11 0500 07/04/11 0600 07/04/11 0900  BP: 149/66  152/79 124/84  Pulse: 75  63 74  Temp: 98 F (36.7 C)  98 F (36.7 C) 97.8 F (36.6 C)  TempSrc:    Oral  Resp: 16  16 20   Height:      Weight:  70.2 kg (154 lb 12.2 oz) 72.4 kg (159 lb 9.8 oz)   SpO2: 93%  94% 93%    CBG (last 3)  Basename 07/02/11 1520  GLUCAP 122*   02/03 0701 - 02/04 0700 In: -  Out: 400 [Urine:400] IV Fluid Intake:     . sodium chloride Stopped (07/04/11 1102)   Medications    . amitriptyline  10 mg Oral BID  . clopidogrel  75 mg Oral Q breakfast  . digoxin  125 mcg Oral Daily  . donepezil  10 mg Oral QHS  . fenofibrate  54 mg Oral Daily  . metoprolol tartrate  25 mg Oral BID  . pantoprazole  40 mg Oral Q0600  . simvastatin  20 mg Oral q1800  . sodium chloride  3 mL Intravenous Q12H  PRN albuterol, cloNIDine, guaiFENesin-dextromethorphan, HYDROcodone-acetaminophen, ondansetron (ZOFRAN) IV, ondansetron  Diet:  Cardiac thin liquids Activity:  Up with assistance DVT Prophylaxis: SCD's  Significant Diagnostic Studies: CBC    Component Value Date/Time   WBC 10.7* 07/04/2011 0500   RBC 4.57 07/04/2011 0500   HGB 12.4 07/04/2011 0500   HCT 40.5 07/04/2011 0500   PLT 279 07/04/2011 0500   MCV 88.6 07/04/2011 0500   MCH 27.1 07/04/2011 0500   MCHC 30.6 07/04/2011 0500   RDW 14.3 07/04/2011 0500   LYMPHSABS 2.1 07/02/2011 1526   MONOABS 0.8 07/02/2011 1526   EOSABS 0.2 07/02/2011 1526   BASOSABS 0.1 07/02/2011 1526    CMP    Component Value Date/Time   NA 140 07/04/2011 0500   K 3.9 07/04/2011 0500   CL 110 07/04/2011 0500   CO2 21 07/04/2011 0500   GLUCOSE 105* 07/04/2011 0500   BUN 34* 07/04/2011 0500   CREATININE 2.22* 07/04/2011 0500   CALCIUM 8.9 07/04/2011 0500   PROT 6.7 07/02/2011 1526   ALBUMIN 3.2* 07/02/2011 1526   AST 20 07/02/2011 1526   ALT 10 07/02/2011 1526   ALKPHOS 70 07/02/2011 1526   BILITOT 0.2* 07/02/2011 1526   GFRNONAA 20* 07/04/2011 0500   GFRAA 23* 07/04/2011 0500   COAGS Lab Results  Component Value Date   INR 1.14 07/03/2011   INR 1.03 07/02/2011   Lipid Panel    Component Value Date/Time   CHOL 164 07/03/2011 0520   TRIG 195* 07/03/2011 0520   HDL 32* 07/03/2011 0520   CHOLHDL 5.1 07/03/2011 0520   VLDL 39 07/03/2011 0520   LDLCALC 93 07/03/2011 0520    07/02/2011.  CT HEAD WITHOUT CONTRAST    1.  No acute intracranial abnormality. 2.  Stable mild cerebral atrophy, chronic small vessel disease, and old lacunar  infarcts.   07/03/2011 CT head without contracts atrophy and diffuse white matter disease. This likely reflects the sequelae of chronic microvascular ischemia. 2.  Remote lacunar infarcts of the basal ganglia and brainstem. 3.  No acute intracranial abnormality or significant interval change.   07/02/2011  PORTABLE CHEST - 1 VIEW   Stable cardiomegaly.  No acute findings.   Physical Exam -  Awake  Alert oriented x 3. Normal speech and language. Diminished recall, attention and registration.eye movements full without nystagmus. Face symmetric. Tongue midline. Normal strength, tone, reflexes and coordination. Normal sensation. Gait deferred.   ASSESSMENT Ms. Erica Stanton is a 78 y.o. female with 1. Left brain TIA - R UE tingling while on ASA 81 mg QD. 2. CRF 3. Hyperlipidemia-LDL 63 4. Hypertension- keep MAP b/w 120 to 130  5. Baseline dementia  Stroke risk factors:  HTN, hyperlipidemia  Hospital day # 2  TREATMENT/PLAN 1. Plavix for secondary stroke prevention.  2. Echo  pending. 3. Ok for discharge one echo completed. 4. Stroke Service will sign off. Follow up with Dr. Pearlean Brownie as needed.  Joaquin Music, ANP-BC, GNP-BC [facility] Pager: [phone] 07/04/2011 11:45 AM  Dr. Delia Heady, Stroke Center Medical Director, has personally reviewed chart, pertinent data, examined the patient and developed the plan of care.

## 2011-07-04 NOTE — Discharge Summary (Signed)
Discharge Summary  SURIAH Stanton MR#: [id number]  DOB:December 05, 1927  Date of Admission: 07/02/2011 Date of Discharge: 07/04/2011  Patient's PCP: Hayden Rasmussen, NP, NP  Attending Physician:Ova Gillentine  Consults:   #1 neurology: Dr. Lyman Speller  Discharge Diagnoses: TIA (transient ischemic attack) Present on Admission:  .Abdominal pain, epigastric .ADENOCARCINOMA, COLON, HX OF .Gastroparesis .GERD .GASTRITIS .WEIGHT LOSS .TIA (transient ischemic attack) .CKD (chronic kidney disease) stage 4, GFR 15-29 ml/min .Dementia   Brief Admitting History and Physical Erica Stanton is a 78 y.o. female, with history of hypertension, dyslipidemia, CTD stage IV, as instructed ER with sudden onset of left-sided weakness and tingling in his started abruptly at home, no headache no vision no swallowing no speech problems, by the time she arrived in the ER her weakness had resolved at this point she has only mild tingling in her left arm. She was seen by neurology and had an unremarkable CT brain. I was called to admit the patient for possible TIA/resolving stroke. For the rest of admission history and physical please see H&P dictated by Dr. Thedore Mins.     Discharge Medications Medication List  As of 07/04/2011  1:17 PM   START taking these medications         clopidogrel 75 MG tablet   Commonly known as: PLAVIX   Take 1 tablet (75 mg total) by mouth daily with breakfast.         CONTINUE taking these medications         amitriptyline 10 MG tablet   Commonly known as: ELAVIL      digoxin 0.125 MG tablet   Commonly known as: LANOXIN      donepezil 10 MG tablet   Commonly known as: ARICEPT      fenofibrate 48 MG tablet   Commonly known as: TRICOR      losartan 100 MG tablet   Commonly known as: COZAAR      metoprolol 50 MG tablet   Commonly known as: LOPRESSOR      simvastatin 20 MG tablet   Commonly known as: ZOCOR         STOP taking these medications         aspirin 81 MG  chewable tablet          Where to get your medications    These are the prescriptions that you need to pick up.   You may get these medications from any pharmacy.         clopidogrel 75 MG tablet           Hospital Course: TIA (transient ischemic attack) Patient was admitted with right upper extremity weakness and tingling with significant improvement by the time she had reached the ED. Patient was admitted to the telemetry floor for stroke workup. CT of the head which was obtained was unremarkable. A neurology consultation was obtained and patient was in consultation by Dr. Lyman Speller of on 07/02/2011. Patient was initially maintained on aspirin and a stroke workup was undertaken. Due to patient's pacemaker she was unable to get a MRI of the head. As such a repeat CT of the head was done 24 hours post presentation and from initial head CT which came back with no acute abnormalities. Carotid Dopplers which were done preliminary results were negative for any significant internal carotid artery stenosis. 2-D echo was done and was pending at the time of discharge and will need to be followed up upon per PCP. Patient's aspirin was subsequently changed to Plavix. Patient improved  on a daily basis and improved clinically during this hospitalization. She was followed by PT during the hospitalization and home health physical therapy was recommended. Lipid panel which was obtained did show a total cholesterol 164 triglycerides of 195 and the LDL of 93. Patient is already on TriCor as well as a statin. Patient will be discharged home in stable and improved condition with home health PT. The rest of patient's chronic medical issues remained stable throughout the hospitalization.  Present on Admission:  .Abdominal pain, epigastric .ADENOCARCINOMA, COLON, HX OF .Gastroparesis .GERD .GASTRITIS .WEIGHT LOSS .TIA (transient ischemic attack) .CKD (chronic kidney disease) stage 4, GFR 15-29 ml/min .Dementia    Day of Discharge BP 124/84  Pulse 74  Temp(Src) 97.8 F (36.6 C) (Oral)  Resp 20  Ht 5' 5.5" (1.664 m)  Wt 72.4 kg (159 lb 9.8 oz)  BMI 26.16 kg/m2  SpO2 93% Subjective: Patient states right upper extremity weakness and tingling has improved since admission. General: Alert, awake, oriented x3, in no acute distress. HEENT: No bruits, no goiter. Heart: Regular rate and rhythm, without murmurs, rubs, gallops. Lungs: Clear to auscultation bilaterally. Abdomen: Soft, nontender, nondistended, positive bowel sounds. Extremities: No clubbing cyanosis or edema with positive pedal pulses. Neuro: Cranial nerves II through XII are grossly intact. 5 out of 5 bilateral upper extremity strength., 5 bilateral lower extremity strength. Grossly intact, nonfocal.   Results for orders placed during the hospital encounter of 07/02/11 (from the past 48 hour(s))  GLUCOSE, CAPILLARY     Status: Abnormal   Collection Time   07/02/11  3:20 PM      Component Value Range Comment   Glucose-Capillary 122 (*) 70 - 99 (mg/dL)    Comment 1 Documented in Chart      Comment 2 Notify RN     PROTIME-INR     Status: Normal   Collection Time   07/02/11  3:26 PM      Component Value Range Comment   Prothrombin Time 13.7  11.6 - 15.2 (seconds)    INR 1.03  0.00 - 1.49    APTT     Status: Normal   Collection Time   07/02/11  3:26 PM      Component Value Range Comment   aPTT 29  24 - 37 (seconds)   CBC     Status: Abnormal   Collection Time   07/02/11  3:26 PM      Component Value Range Comment   WBC 13.8 (*) 4.0 - 10.5 (K/uL)    RBC 4.82  3.87 - 5.11 (MIL/uL)    Hemoglobin 13.1  12.0 - 15.0 (g/dL)    HCT 82.9  56.2 - 13.0 (%)    MCV 89.0  78.0 - 100.0 (fL)    MCH 27.2  26.0 - 34.0 (pg)    MCHC 30.5  30.0 - 36.0 (g/dL)    RDW 86.5  78.4 - 69.6 (%)    Platelets 309  150 - 400 (K/uL)   DIFFERENTIAL     Status: Abnormal   Collection Time   07/02/11  3:26 PM      Component Value Range Comment   Neutrophils  Relative 77  43 - 77 (%)    Neutro Abs 10.6 (*) 1.7 - 7.7 (K/uL)    Lymphocytes Relative 15  12 - 46 (%)    Lymphs Abs 2.1  0.7 - 4.0 (K/uL)    Monocytes Relative 6  3 - 12 (%)    Monocytes Absolute 0.8  0.1 - 1.0 (K/uL)    Eosinophils Relative 2  0 - 5 (%)    Eosinophils Absolute 0.2  0.0 - 0.7 (K/uL)    Basophils Relative 0  0 - 1 (%)    Basophils Absolute 0.1  0.0 - 0.1 (K/uL)   COMPREHENSIVE METABOLIC PANEL     Status: Abnormal   Collection Time   07/02/11  3:26 PM      Component Value Range Comment   Sodium 140  135 - 145 (mEq/L)    Potassium 4.5  3.5 - 5.1 (mEq/L)    Chloride 107  96 - 112 (mEq/L)    CO2 22  19 - 32 (mEq/L)    Glucose, Bld 132 (*) 70 - 99 (mg/dL)    BUN 28 (*) 6 - 23 (mg/dL)    Creatinine, Ser 5.62 (*) 0.50 - 1.10 (mg/dL)    Calcium 9.3  8.4 - 10.5 (mg/dL)    Total Protein 6.7  6.0 - 8.3 (g/dL)    Albumin 3.2 (*) 3.5 - 5.2 (g/dL)    AST 20  0 - 37 (U/L)    ALT 10  0 - 35 (U/L)    Alkaline Phosphatase 70  39 - 117 (U/L)    Total Bilirubin 0.2 (*) 0.3 - 1.2 (mg/dL)    GFR calc non Af Amer 21 (*) >90 (mL/min)    GFR calc Af Amer 25 (*) >90 (mL/min)   CK TOTAL AND CKMB     Status: Normal   Collection Time   07/02/11  3:26 PM      Component Value Range Comment   Total CK 22  7 - 177 (U/L)    CK, MB 1.9  0.3 - 4.0 (ng/mL)    Relative Index RELATIVE INDEX IS INVALID  0.0 - 2.5    TROPONIN I     Status: Normal   Collection Time   07/02/11  3:26 PM      Component Value Range Comment   Troponin I <0.30  <0.30 (ng/mL)   POCT I-STAT, CHEM 8     Status: Abnormal   Collection Time   07/02/11  3:34 PM      Component Value Range Comment   Sodium 142  135 - 145 (mEq/L)    Potassium 4.6  3.5 - 5.1 (mEq/L)    Chloride 112  96 - 112 (mEq/L)    BUN 31 (*) 6 - 23 (mg/dL)    Creatinine, Ser 1.30 (*) 0.50 - 1.10 (mg/dL)    Glucose, Bld 865 (*) 70 - 99 (mg/dL)    Calcium, Ion 7.84  1.12 - 1.32 (mmol/L)    TCO2 24  0 - 100 (mmol/L)    Hemoglobin 14.3  12.0 - 15.0 (g/dL)     HCT 69.6  29.5 - 28.4 (%)   URINALYSIS, ROUTINE W REFLEX MICROSCOPIC     Status: Abnormal   Collection Time   07/02/11  9:00 PM      Component Value Range Comment   Color, Urine YELLOW  YELLOW     APPearance CLOUDY (*) CLEAR     Specific Gravity, Urine 1.018  1.005 - 1.030     pH 6.0  5.0 - 8.0     Glucose, UA 100 (*) NEGATIVE (mg/dL)    Hgb urine dipstick SMALL (*) NEGATIVE     Bilirubin Urine NEGATIVE  NEGATIVE     Ketones, ur NEGATIVE  NEGATIVE (mg/dL)    Protein, ur 132 (*) NEGATIVE (mg/dL)  Urobilinogen, UA 0.2  0.0 - 1.0 (mg/dL)    Nitrite NEGATIVE  NEGATIVE     Leukocytes, UA NEGATIVE  NEGATIVE    URINE CULTURE     Status: Normal   Collection Time   07/02/11  9:00 PM      Component Value Range Comment   Specimen Description URINE, CLEAN CATCH      Special Requests NONE      Culture  Setup Time [id number]      Colony Count 75,000 COLONIES/ML      Culture        Value: Multiple bacterial morphotypes present, none predominant. Suggest appropriate recollection if clinically indicated.   Report Status 07/04/2011 FINAL     URINE MICROSCOPIC-ADD ON     Status: Abnormal   Collection Time   07/02/11  9:00 PM      Component Value Range Comment   Squamous Epithelial / LPF MANY (*) RARE     WBC, UA 3-6  <3 (WBC/hpf)    RBC / HPF 3-6  <3 (RBC/hpf)    Bacteria, UA FEW (*) RARE     Casts HYALINE CASTS (*) NEGATIVE     Urine-Other MUCOUS PRESENT     LIPID PANEL     Status: Abnormal   Collection Time   07/03/11  5:20 AM      Component Value Range Comment   Cholesterol 164  0 - 200 (mg/dL)    Triglycerides 147 (*) <150 (mg/dL)    HDL 32 (*) >82 (mg/dL)    Total CHOL/HDL Ratio 5.1      VLDL 39  0 - 40 (mg/dL)    LDL Cholesterol 93  0 - 99 (mg/dL)   BASIC METABOLIC PANEL     Status: Abnormal   Collection Time   07/03/11  5:20 AM      Component Value Range Comment   Sodium 137  135 - 145 (mEq/L)    Potassium 3.9  3.5 - 5.1 (mEq/L)    Chloride 107  96 - 112 (mEq/L)    CO2 22  19 -  32 (mEq/L)    Glucose, Bld 96  70 - 99 (mg/dL)    BUN 27 (*) 6 - 23 (mg/dL)    Creatinine, Ser 9.56 (*) 0.50 - 1.10 (mg/dL)    Calcium 9.1  8.4 - 10.5 (mg/dL)    GFR calc non Af Amer 22 (*) >90 (mL/min)    GFR calc Af Amer 25 (*) >90 (mL/min)   CBC     Status: Abnormal   Collection Time   07/03/11  5:20 AM      Component Value Range Comment   WBC 11.6 (*) 4.0 - 10.5 (K/uL)    RBC 4.56  3.87 - 5.11 (MIL/uL)    Hemoglobin 12.1  12.0 - 15.0 (g/dL)    HCT 21.3  08.6 - 57.8 (%)    MCV 87.7  78.0 - 100.0 (fL)    MCH 26.5  26.0 - 34.0 (pg)    MCHC 30.3  30.0 - 36.0 (g/dL)    RDW 46.9  62.9 - 52.8 (%)    Platelets 273  150 - 400 (K/uL)   HEMOGLOBIN A1C     Status: Normal   Collection Time   07/03/11  5:20 AM      Component Value Range Comment   Hemoglobin A1C 5.6  <5.7 (%)    Mean Plasma Glucose 114  <117 (mg/dL)   PROTIME-INR     Status: Normal  Collection Time   07/03/11  5:20 AM      Component Value Range Comment   Prothrombin Time 14.8  11.6 - 15.2 (seconds)    INR 1.14  0.00 - 1.49    TSH     Status: Normal   Collection Time   07/03/11  5:20 AM      Component Value Range Comment   TSH 1.746  0.350 - 4.500 (uIU/mL)   BASIC METABOLIC PANEL     Status: Abnormal   Collection Time   07/04/11  5:00 AM      Component Value Range Comment   Sodium 140  135 - 145 (mEq/L)    Potassium 3.9  3.5 - 5.1 (mEq/L)    Chloride 110  96 - 112 (mEq/L)    CO2 21  19 - 32 (mEq/L)    Glucose, Bld 105 (*) 70 - 99 (mg/dL)    BUN 34 (*) 6 - 23 (mg/dL)    Creatinine, Ser 6.96 (*) 0.50 - 1.10 (mg/dL)    Calcium 8.9  8.4 - 10.5 (mg/dL)    GFR calc non Af Amer 20 (*) >90 (mL/min)    GFR calc Af Amer 23 (*) >90 (mL/min)   CBC     Status: Abnormal   Collection Time   07/04/11  5:00 AM      Component Value Range Comment   WBC 10.7 (*) 4.0 - 10.5 (K/uL)    RBC 4.57  3.87 - 5.11 (MIL/uL)    Hemoglobin 12.4  12.0 - 15.0 (g/dL)    HCT 29.5  28.4 - 13.2 (%)    MCV 88.6  78.0 - 100.0 (fL)    MCH 27.1  26.0 -  34.0 (pg)    MCHC 30.6  30.0 - 36.0 (g/dL)    RDW 44.0  10.2 - 72.5 (%)    Platelets 279  150 - 400 (K/uL)     Ct Head Wo Contrast  07/03/2011  *RADIOLOGY REPORT*  Clinical Data: Question CVA.  Code stroke yesterday.  24 are follow- up.  CT HEAD WITHOUT CONTRAST  Technique:  Contiguous axial images were obtained from the base of the skull through the vertex without contrast.  Comparison: CT head without contrast 07/02/2011.  Findings: Extensive periventricular and subcortical white matter hypoattenuation is stable.  Remote lacunar infarcts of the basal ganglia are stable bilaterally.  No acute cortical infarct, hemorrhage, or mass lesion is present.  Generalized atrophy is similar to the prior exam.  There is a remote infarct of the right pons.  The paranasal sinuses and mastoid air cells are clear.  The osseous skull is intact.  Atherosclerotic calcifications are present within the cavernous carotid arteries bilaterally.  IMPRESSION:  1.  Stable atrophy and diffuse white matter disease.  This likely reflects the sequelae of chronic microvascular ischemia. 2.  Remote lacunar infarcts of the basal ganglia and brainstem. 3.  No acute intracranial abnormality or significant interval change.  Original Report Authenticated By: Jamesetta Orleans. MATTERN, M.D.   Ct Head Wo Contrast  07/02/2011  *RADIOLOGY REPORT*  Clinical Data: Acute stroke.  Slurred speech and right-sided facial numbness.  CT HEAD WITHOUT CONTRAST  Technique:  Contiguous axial images were obtained from the base of the skull through the vertex without contrast.  Comparison: 09/29/2010  Findings: There is no evidence of intracranial hemorrhage, brain edema or other signs of acute infarction.  There is no evidence of intracranial mass lesion or mass effect.  No abnormal extra-axial fluid collections  are identified.  Mild diffuse cerebral atrophy appears stable.  Chronic small vessel disease is also unchanged. The Panda thinner infarcts are seen  involving the basal ganglia bilaterally and right pons.  No evidence of hydrocephalus.  No skull abnormality identified.  IMPRESSION:  1.  No acute intracranial abnormality. 2.  Stable mild cerebral atrophy, chronic small vessel disease, and old lacunar infarcts.  Original Report Authenticated By: Danae Orleans, M.D.   Dg Chest Port 1 View  07/02/2011  *RADIOLOGY REPORT*  Clinical Data: Cough.  PORTABLE CHEST - 1 VIEW  Comparison: 09/29/2010  Findings: Cardiomegaly and tortuosity of the thoracic aorta are stable.  Both lungs are clear.  No evidence of pleural effusion. Dual lead transvenous pacemaker remains in stable position.  IMPRESSION: Stable cardiomegaly.  No acute findings.  Original Report Authenticated By: Danae Orleans, M.D.     Disposition: Home with home health physical therapy  Diet: Low sodium heart healthy diet  Activity: Increase activity slowly/per home health PT   Follow-up Appts: Discharge Orders    Future Orders Please Complete By Expires   Diet - low sodium heart healthy      Increase activity slowly      Comments:   Per HHPT   Discharge instructions      Comments:   Follow up with MABE,DAVID, NP, on 07/26/11 at 11am       TESTS THAT NEED FOLLOW-UP 2-D echo and final results of carotid Dopplers.  Time spent on discharge, talking to the patient, and coordinating care: 60 mins.   Signed: Michel Eskelson 07/04/2011, 1:17 PM

## 2011-07-04 NOTE — Progress Notes (Signed)
   CARE MANAGEMENT NOTE 07/04/2011  Patient:  Erica Stanton, Erica Stanton   Account Number:  [id number]  Date Initiated:  07/04/2011  Documentation initiated by:  Donn Pierini  Subjective/Objective Assessment:   Pt admitted with TIA right arm weakness     Action/Plan:   PTA pt lived at home with family, was independent with ADLs- PT/OT evals ordered   Anticipated DC Date:  07/04/2011   Anticipated DC Plan:  HOME W HOME HEALTH SERVICES      DC Planning Services  CM consult      [facility] Choice  HOME HEALTH   Choice offered to / List presented to:  C-1 Patient        HH arranged  HH-2 PT      [facility] agency  [facility]   Status of service:  Completed, signed off Medicare Important Message given?   (If response is "NO", the following Medicare IM given date fields will be blank) Date Medicare IM given:   Date Additional Medicare IM given:    Discharge Disposition:  HOME W HOME HEALTH SERVICES  Per UR Regulation:  Reviewed for med. necessity/level of care/duration of stay  Comments:  PCP- Dr. Phineas Real  07/04/11- 1400- Donn Pierini RN, BSN [phone] Pt for d/c today, order for HH-PT- spoke with pt at bedside- list of agencies for [facility] given to pt-per conversation pt would like to use [facility]- no DME needs at this time. Referral sent to [facility] via [facility] and call made to [facility] with [facility]. Services to begin 24-48 hr post discharge.

## 2011-07-04 NOTE — Progress Notes (Signed)
  Echocardiogram 2D Echocardiogram has been performed.  Erica Stanton Erica Stanton 07/04/2011, 1:13 PM

## 2011-07-04 NOTE — Progress Notes (Signed)
VASCULAR LAB PRELIMINARY  PRELIMINARY  PRELIMINARY  PRELIMINARY  Carotid duplex  completed.    Preliminary report:  Bilateral:  No evidence of hemodynamically significant internal carotid artery stenosis.   Vertebral artery flow is antegrade.      Terance Hart, RVT 07/04/2011, 10:58 AM

## 2011-07-04 NOTE — Progress Notes (Signed)
HOME HEALTH AGENCIES SERVING [place]   Agencies that are Medicare-Certified and are affiliated with [facility]  Telephone Number Address  [facility]   The [facility] has ownership interest in this company; however, you are under no obligation to use this agency. [phone] or  [phone] [address] [place], [place] [address]   Agencies that are Medicare-Certified and are not affiliated with [facility]                                                                                 Home Health Agency Telephone Number Address  [facility] [phone] Fax [phone] [address], [address] [place], [place]  [address]  [facility] [phone] or [phone] Fax [phone] [address] [place], [place] [address]  [facility] [phone] Fax [phone] [address] [place], [place] [address]  [facility] [phone] Fax [phone] [address] [place], [place]  [address]  [facility] [phone] Fax [phone] [address], [address] [place], [place] [address]  [facility] [phone] [address] [place], [place] [address]  [facility] [phone]  [address] [place], [place] [address]  [facility] [phone] or [phone] Fax [phone] [address] [place], [place]  [address]  [facility] [phone] Fax [phone] [address] [place], [place]  [address]  [facility]  [phone] Fax [phone] [address] [place], [place] [address]               Agencies that are not Medicare-Certified and are not affiliated with [facility]   Home Health Agency Telephone Number Address  [facility] [phone] or [phone] Fax [phone] [address] [place], [place]  [address]  [facility] [phone] Fax [phone] [address] [place], [place]  [address]  [facility]  [phone] Fax [phone] [address] [place], [place] [address]  [facility] [phone] Fax [phone] [address] , [place] [address]  [facility] [phone] or [phone] Fax [phone] [address] [place], [place]  [address]  [facility] [phone] or [phone] Fax [phone] [address] [place], [place]  [address]  [facility] [phone] Fax [phone] [address] [place], [place]  [address]  [facility] [phone] [address] [place], [place]  [address]  [facility] [phone] Fax [phone] [address] [place], [place]  [address]  [facility] [phone] Fax [phone] [address] [place], [place]  [address]  [facility] [phone] Fax [phone] [address] [place], [place] [address]  [facility] [phone] Fax [phone] [address] [place], [place]  [address]   

## 2012-02-21 ENCOUNTER — Encounter: Payer: Self-pay | Admitting: Gastroenterology

## 2012-07-23 ENCOUNTER — Other Ambulatory Visit (HOSPITAL_COMMUNITY): Payer: Self-pay | Admitting: Cardiovascular Disease

## 2012-07-23 DIAGNOSIS — R0989 Other specified symptoms and signs involving the circulatory and respiratory systems: Secondary | ICD-10-CM

## 2012-08-06 ENCOUNTER — Inpatient Hospital Stay (HOSPITAL_COMMUNITY): Admission: RE | Admit: 2012-08-06 | Payer: Medicare Other | Source: Ambulatory Visit

## 2012-08-07 ENCOUNTER — Ambulatory Visit (HOSPITAL_COMMUNITY)
Admission: RE | Admit: 2012-08-07 | Discharge: 2012-08-07 | Disposition: A | Discharge status: Home / Self Care | Payer: Medicare Other | Source: Ambulatory Visit | Attending: Cardiovascular Disease | Admitting: Cardiovascular Disease

## 2012-08-07 DIAGNOSIS — R0989 Other specified symptoms and signs involving the circulatory and respiratory systems: Secondary | ICD-10-CM

## 2012-08-07 NOTE — Progress Notes (Signed)
Carotid Duplex Complete Erica [place] 

## 2012-09-30 LAB — PACEMAKER DEVICE OBSERVATION

## 2012-12-31 LAB — PACEMAKER DEVICE OBSERVATION

## 2013-02-04 ENCOUNTER — Other Ambulatory Visit: Payer: Self-pay | Admitting: *Deleted

## 2013-02-04 DIAGNOSIS — I350 Nonrheumatic aortic (valve) stenosis: Secondary | ICD-10-CM

## 2013-02-04 LAB — PACEMAKER DEVICE OBSERVATION

## 2013-02-14 ENCOUNTER — Telehealth: Payer: Self-pay | Admitting: *Deleted

## 2013-02-14 NOTE — Telephone Encounter (Signed)
Erica Stanton comes into the office requesting a copy of his mom's medication list.  I have corrected the medication list according to the paper chart from Dr Kandis Cocking office visit.

## 2013-02-15 NOTE — Telephone Encounter (Signed)
I provided a copy of the medications to [place].

## 2013-02-22 ENCOUNTER — Encounter: Payer: Self-pay | Admitting: Cardiovascular Disease

## 2013-03-07 ENCOUNTER — Ambulatory Visit (HOSPITAL_COMMUNITY)
Admission: RE | Admit: 2013-03-07 | Discharge: 2013-03-07 | Disposition: A | Discharge status: Home / Self Care | Payer: Medicare Other | Source: Ambulatory Visit | Attending: Cardiovascular Disease | Admitting: Cardiovascular Disease

## 2013-03-07 DIAGNOSIS — I359 Nonrheumatic aortic valve disorder, unspecified: Secondary | ICD-10-CM

## 2013-03-07 DIAGNOSIS — I350 Nonrheumatic aortic (valve) stenosis: Secondary | ICD-10-CM

## 2013-03-07 NOTE — Progress Notes (Signed)
2D Echo Performed 03/07/2013    Detrice Cales, RCS  

## 2013-04-02 ENCOUNTER — Telehealth: Payer: Self-pay | Admitting: Internal Medicine

## 2013-04-02 NOTE — Telephone Encounter (Signed)
Pt has Jan recall with Dr C and remote in Dec/mt

## 2013-05-11 LAB — PACEMAKER DEVICE OBSERVATION

## 2013-05-13 ENCOUNTER — Ambulatory Visit (INDEPENDENT_AMBULATORY_CARE_PROVIDER_SITE_OTHER): Payer: Medicare Other

## 2013-05-13 DIAGNOSIS — R001 Bradycardia, unspecified: Secondary | ICD-10-CM

## 2013-05-13 DIAGNOSIS — I498 Other specified cardiac arrhythmias: Secondary | ICD-10-CM

## 2013-05-16 ENCOUNTER — Encounter: Payer: Self-pay | Admitting: *Deleted

## 2013-05-16 LAB — MDC_IDC_ENUM_SESS_TYPE_REMOTE
Battery Voltage: 2.93 V
Brady Statistic RA Percent Paced: 97 %
Brady Statistic RV Percent Paced: 7.8 %
Lead Channel Impedance Value: 790 Ohm
Lead Channel Pacing Threshold Amplitude: 1.125 V
Lead Channel Pacing Threshold Pulse Width: 0.6 ms
Lead Channel Sensing Intrinsic Amplitude: 1 mV
Lead Channel Setting Pacing Amplitude: 2 V
Lead Channel Setting Pacing Pulse Width: 0.6 ms
Lead Channel Setting Sensing Sensitivity: 1.5 mV

## 2013-07-04 ENCOUNTER — Ambulatory Visit (INDEPENDENT_AMBULATORY_CARE_PROVIDER_SITE_OTHER): Payer: Medicare Other | Admitting: Cardiovascular Disease

## 2013-07-04 ENCOUNTER — Encounter: Payer: Self-pay | Admitting: *Deleted

## 2013-07-04 ENCOUNTER — Encounter: Payer: Self-pay | Admitting: Cardiovascular Disease

## 2013-07-04 VITALS — BP 132/66 | HR 70 | Resp 20 | Ht 65.5 in | Wt 150.0 lb

## 2013-07-04 DIAGNOSIS — R001 Bradycardia, unspecified: Secondary | ICD-10-CM

## 2013-07-04 DIAGNOSIS — E785 Hyperlipidemia, unspecified: Secondary | ICD-10-CM

## 2013-07-04 DIAGNOSIS — Z950 Presence of cardiac pacemaker: Secondary | ICD-10-CM

## 2013-07-04 DIAGNOSIS — E782 Mixed hyperlipidemia: Secondary | ICD-10-CM

## 2013-07-04 DIAGNOSIS — Z79899 Other long term (current) drug therapy: Secondary | ICD-10-CM

## 2013-07-04 DIAGNOSIS — N184 Chronic kidney disease, stage 4 (severe): Secondary | ICD-10-CM

## 2013-07-04 DIAGNOSIS — R5383 Other fatigue: Secondary | ICD-10-CM

## 2013-07-04 DIAGNOSIS — I350 Nonrheumatic aortic (valve) stenosis: Secondary | ICD-10-CM

## 2013-07-04 DIAGNOSIS — I359 Nonrheumatic aortic valve disorder, unspecified: Secondary | ICD-10-CM

## 2013-07-04 DIAGNOSIS — I498 Other specified cardiac arrhythmias: Secondary | ICD-10-CM

## 2013-07-04 DIAGNOSIS — R5381 Other malaise: Secondary | ICD-10-CM

## 2013-07-04 LAB — MDC_IDC_ENUM_SESS_TYPE_INCLINIC
Battery Remaining Longevity: 84 mo
Battery Voltage: 2.93 V
Lead Channel Impedance Value: 800 Ohm
Lead Channel Pacing Threshold Amplitude: 0.5 V
Lead Channel Pacing Threshold Amplitude: 0.875 V
Lead Channel Pacing Threshold Pulse Width: 0.6 ms
Lead Channel Sensing Intrinsic Amplitude: 3 mV
Lead Channel Sensing Intrinsic Amplitude: 4.8 mV
Lead Channel Setting Pacing Amplitude: 1.25 V
Lead Channel Setting Pacing Amplitude: 2 V
Lead Channel Setting Pacing Pulse Width: 0.6 ms
Lead Channel Setting Sensing Sensitivity: 1.5 mV
MDC IDC MSMT LEADCHNL RA PACING THRESHOLD AMPLITUDE: 0.5 V
MDC IDC MSMT LEADCHNL RA PACING THRESHOLD PULSEWIDTH: 0.5 ms
MDC IDC MSMT LEADCHNL RA PACING THRESHOLD PULSEWIDTH: 0.5 ms
MDC IDC MSMT LEADCHNL RV IMPEDANCE VALUE: 525 Ohm
MDC IDC PG SERIAL: 7074704
MDC IDC SESS DTM: 20150205120945
MDC IDC STAT BRADY RA PERCENT PACED: 97 %
MDC IDC STAT BRADY RV PERCENT PACED: 6.4 %

## 2013-07-04 LAB — CBC
HCT: 36.9 % (ref 36.0–46.0)
Hemoglobin: 11.9 g/dL — ABNORMAL LOW (ref 12.0–15.0)
MCH: 27.8 pg (ref 26.0–34.0)
MCHC: 32.2 g/dL (ref 30.0–36.0)
MCV: 86.2 fL (ref 78.0–100.0)
PLATELETS: 334 10*3/uL (ref 150–400)
RBC: 4.28 MIL/uL (ref 3.87–5.11)
RDW: 14.8 % (ref 11.5–15.5)
WBC: 9.5 10*3/uL (ref 4.0–10.5)

## 2013-07-04 LAB — PACEMAKER DEVICE OBSERVATION

## 2013-07-04 NOTE — Patient Instructions (Addendum)
Remote monitoring is used to monitor your pacemaker from home. This monitoring reduces the number of office visits required to check your device to one time per year. It allows [place] to keep an eye on the functioning of your device to ensure it is working properly. You are scheduled for a device check from home on 10-03-2013. You may send your transmission at any time that day. If you have a wireless device, the transmission will be sent automatically. After your physician reviews your transmission, you will receive a postcard with your next transmission date.   Your physician recommends that you return for lab work as soon as you can. Please make sure you get these labs while fasting---(nothing to eat or drink after midnight).  Your physician recommends that you schedule a follow-up appointment in: 6 months with Dr.Croitoru

## 2013-07-05 LAB — COMPREHENSIVE METABOLIC PANEL
ALK PHOS: 62 U/L (ref 39–117)
ALT: 9 U/L (ref 0–35)
AST: 22 U/L (ref 0–37)
Albumin: 4 g/dL (ref 3.5–5.2)
BILIRUBIN TOTAL: 0.5 mg/dL (ref 0.2–1.2)
BUN: 35 mg/dL — ABNORMAL HIGH (ref 6–23)
CO2: 20 meq/L (ref 19–32)
Calcium: 8.7 mg/dL (ref 8.4–10.5)
Chloride: 110 mEq/L (ref 96–112)
Creat: 3.07 mg/dL — ABNORMAL HIGH (ref 0.50–1.10)
Glucose, Bld: 90 mg/dL (ref 70–99)
Potassium: 4.8 mEq/L (ref 3.5–5.3)
SODIUM: 141 meq/L (ref 135–145)
TOTAL PROTEIN: 6.2 g/dL (ref 6.0–8.3)

## 2013-07-05 LAB — LIPID PANEL
Cholesterol: 160 mg/dL (ref 0–200)
HDL: 40 mg/dL (ref >39)
LDL Cholesterol: 78 mg/dL (ref 0–99)
Total CHOL/HDL Ratio: 4 Ratio
Triglycerides: 211 mg/dL — ABNORMAL HIGH (ref <150)
VLDL: 42 mg/dL — ABNORMAL HIGH (ref 0–40)

## 2013-07-10 ENCOUNTER — Encounter: Payer: Self-pay | Admitting: Gastroenterology

## 2013-07-11 ENCOUNTER — Encounter: Payer: Self-pay | Admitting: Cardiovascular Disease

## 2013-07-11 DIAGNOSIS — Z950 Presence of cardiac pacemaker: Secondary | ICD-10-CM | POA: Insufficient documentation

## 2013-07-11 DIAGNOSIS — E785 Hyperlipidemia, unspecified: Secondary | ICD-10-CM

## 2013-07-11 DIAGNOSIS — I350 Nonrheumatic aortic (valve) stenosis: Secondary | ICD-10-CM | POA: Insufficient documentation

## 2013-07-11 HISTORY — DX: Hyperlipidemia, unspecified: E78.5

## 2013-07-11 NOTE — Assessment & Plan Note (Signed)
Severe degenerative changes and calcification noted by echo but the estimated aortic valve area is around 1.3 cm square and the mean gradient is only 16 mm Hg. Not yet hemodynamically important

## 2013-07-11 NOTE — Assessment & Plan Note (Addendum)
Normal device function, excellent lead parameters. Expected device longevity another 6-7 years. There is virtually 100% atrial pacing but only 67% ventricular pacing. The underlying rhythm is sinus bradycardia in the 40s. No permanent programming changes were made. We discussed remote monitoring. Note is made of occasional episodes of noise reversion on both channels, that have not interfered with device function. Uncertain as to the etiology.

## 2013-07-11 NOTE — Progress Notes (Signed)
Patient ID: STELLAR GENSEL, female   DOB: 20-Nov-1927, 80 y.o.   MRN: [id number]     Reason for office visit Pacemaker followup, aortic stenosis, hyperlipidemia  This is the first time I have met Mrs. Erica Stanton who has followed with Dr. Terance Ice for many years.  She received a dual-chamber permanent pacemaker in 2002 for symptomatic bradycardia and a new generator was changed out in 2010. She had syncope before pacemaker implantation and this has not occurred since she is not pacemaker dependent but does take atrium 90% of the time. For the most part she has intact native AV conduction.  Her device is a Buyer, retail DR RF device.  Significant comorbid conditions include mild valvular aortic stenosis, hyperlipidemia, HTN and a history of TIA in November of 2013. She has moderate to severe chronic kidney disease with a baseline creatinine clearance of around 20 mL per minute.  She has had several surgeries including colon cancer resected in 2001, previous cholecystectomy, previous ventral hernia repair, back surgery.  She has early dementia and lives with her son and daughter-in-law. She has not had any new health challenges since she last saw Dr. Rollene Fare and is currently asymptomatic, without complaints.   Allergies  Allergen Reactions  . Penicillins     REACTION: rash    Current Outpatient Prescriptions  Medication Sig Dispense Refill  . amitriptyline (ELAVIL) 10 MG tablet Take 10 mg by mouth 2 (two) times daily.      Marland Kitchen amLODipine (NORVASC) 5 MG tablet Take 5 mg by mouth daily.      Marland Kitchen aspirin 81 MG tablet Take 81 mg by mouth daily.      . clopidogrel (PLAVIX) 75 MG tablet Take 75 mg by mouth daily.      . digoxin (LANOXIN) 0.125 MG tablet Take 0.125 mg by mouth daily.      Marland Kitchen donepezil (ARICEPT) 10 MG tablet Take 10 mg by mouth at bedtime as needed.      . fenofibrate (TRICOR) 48 MG tablet Take 48 mg by mouth daily.      . simvastatin (ZOCOR) 20 MG tablet Take 20 mg by  mouth every evening.       No current facility-administered medications for this visit.    Past Medical History  Diagnosis Date  . Cancer   . Hx of hernia repair   . Dementia   . Pacemaker 04/16/2009    St.Jude  . Hypertension   . Hyperlipemia   . CKD (chronic kidney disease) stage 4, GFR 15-29 ml/min 07/02/2011  . TIA (transient ischemic attack) 07/02/2011  . Aortic stenosis   . Symptomatic bradycardia     Past Surgical History  Procedure Laterality Date  . Abdominal hysterectomy    . Permanent pacemaker insertion  04/16/2009    St. Jude  . Colon resection    . Cholecystectomy  2007  . Ventral hernia repair  2008  . Back surgery  03/2009    Family history reviewed. No pertinent inheritable disorders identified  History   Social History  . Marital Status: Widowed    Spouse Name: N/A    Number of Children: N/A  . Years of Education: N/A   Occupational History  . Not on file.   Social History Main Topics  . Smoking status: Former Smoker -- 1.50 packs/day for 35 years    Types: Cigarettes  . Smokeless tobacco: Former Systems developer    Quit date: 07/01/1990  . Alcohol Use: No  . Drug  Use: No  . Sexual Activity:    Other Topics Concern  . Not on file   Social History Narrative  . No narrative on file    Review of systems: The patient specifically denies any chest pain at rest or with exertion, dyspnea at rest or with exertion, orthopnea, paroxysmal nocturnal dyspnea, syncope, palpitations, focal neurological deficits, intermittent claudication, lower extremity edema, unexplained weight gain, cough, hemoptysis or wheezing.  The patient also denies abdominal pain, nausea, vomiting, dysphagia, diarrhea, constipation, polyuria, polydipsia, dysuria, hematuria, frequency, urgency, abnormal bleeding or bruising, fever, chills, unexpected weight changes, mood swings, change in skin or hair texture, change in voice quality, auditory or visual problems, allergic reactions or rashes,  new musculoskeletal complaints other than usual "aches and pains".   PHYSICAL EXAM BP 132/66  Pulse 70  Resp 20  Ht 5' 5.5" (1.664 m)  Wt 68.04 kg (150 lb)  BMI 24.57 kg/m2  General: Alert, oriented x3, no distress, kyphosis Head: no evidence of trauma, PERRL, EOMI, no exophtalmos or lid lag, no myxedema, no xanthelasma; normal ears, nose and oropharynx Neck: normal jugular venous pulsations and no hepatojugular reflux; brisk carotid pulses without delay and no carotid bruits Chest: clear to auscultation, no signs of consolidation by percussion or palpation, normal fremitus, symmetrical and full respiratory excursions, healthy-appearing right subclavian pacemaker site Cardiovascular: normal position and quality of the apical impulse, regular rhythm, normal first and second heart sounds, no  Rubs,  or gallops, grade 2/6 early peaking systolic ejection murmur heard at both the right and left upper sternal border radiating towards the carotids Abdomen: no tenderness or distention, no masses by palpation, no abnormal pulsatility or arterial bruits, normal bowel sounds, no hepatosplenomegaly Extremities: no clubbing, cyanosis or edema; 2+ radial, ulnar and brachial pulses bilaterally; 2+ right femoral, posterior tibial and dorsalis pedis pulses; 2+ left femoral, posterior tibial and dorsalis pedis pulses; no subclavian or femoral bruits Neurological: grossly nonfocal   EKG: Atrial paced, ventricular sensed rhythm, ST segment depression with inverted T waves in leads the 3 through V6 as well as in the inferior leads, not much changed from previous tracings. The appearance is suggestive of left ventricular hypertrophy by voltage criteria is not met.  Lipid Panel     Component Value Date/Time   CHOL 160 07/04/2013 1026   TRIG 211* 07/04/2013 1026   HDL 40 07/04/2013 1026   CHOLHDL 4.0 07/04/2013 1026   VLDL 42* 07/04/2013 1026   LDLCALC 78 07/04/2013 1026    BMET    Component Value Date/Time   NA  141 07/04/2013 1026   K 4.8 07/04/2013 1026   CL 110 07/04/2013 1026   CO2 20 07/04/2013 1026   GLUCOSE 90 07/04/2013 1026   BUN 35* 07/04/2013 1026   CREATININE 3.07* 07/04/2013 1026   CREATININE 2.22* 07/04/2011 0500   CALCIUM 8.7 07/04/2013 1026   GFRNONAA 20* 07/04/2011 0500   GFRAA 23* 07/04/2011 0500     ASSESSMENT AND PLAN Pacemaker - St. Jude 2010 Normal device function, excellent lead parameters. Expected device longevity another 6-7 years. There is virtually 100% atrial pacing but only 67% ventricular pacing. The underlying rhythm is sinus bradycardia in the 40s. No permanent programming changes were made. We discussed remote monitoring. Note is made of occasional episodes of noise reversion on both channels, that have not interfered with device function. Uncertain as to the etiology.  Hyperlipidemia Test today showed generally satisfactory parameters. She is already oncombination statin and fenofibrate treatment. The triglycerides are  a little high and the HDL is borderline low. The LDL is within desirable range. More aggressive treatment in this elderly lady with dementia and chronic renal insufficiency does not appear to be justified  CKD (chronic kidney disease) stage 4, GFR 15-29 ml/min Indications need to be dosed appropriately for renal insufficiency. I really am not sure why digoxin has been prescribed. There is no documentation of significant atrial tachyarrhythmia and she does not have congestive heart failure. However recommend discontinuing the potentially toxic medication  Aortic stenosis Severe degenerative changes and calcification noted by echo but the estimated aortic valve area is around 1.3 cm square and the mean gradient is only 16 mm Hg. Not yet hemodynamically important   Patient Instructions  Remote monitoring is used to monitor your pacemaker from home. This monitoring reduces the number of office visits required to check your device to one time per year. It allows [place] to  keep an eye on the functioning of your device to ensure it is working properly. You are scheduled for a device check from home on 10-03-2013. You may send your transmission at any time that day. If you have a wireless device, the transmission will be sent automatically. After your physician reviews your transmission, you will receive a postcard with your next transmission date.   Your physician recommends that you return for lab work as soon as you can. Please make sure you get these labs while fasting---(nothing to eat or drink after midnight).  Your physician recommends that you schedule a follow-up appointment in: 6 months with Dr.Lamae Fosco     Orders Placed This Encounter  Procedures  . Comp Met (CMET)  . CBC  . Lipid Profile  . Implantable device check  . EKG 12-Lead   Meds ordered this encounter  Medications  . digoxin (LANOXIN) 0.125 MG tablet    Sig: Take 0.125 mg by mouth daily.    Erica Humbles, MD, [place] [phone] office [phone] pager

## 2013-07-11 NOTE — Assessment & Plan Note (Signed)
Indications need to be dosed appropriately for renal insufficiency. I really am not sure why digoxin has been prescribed. There is no documentation of significant atrial tachyarrhythmia and she does not have congestive heart failure. However recommend discontinuing the potentially toxic medication

## 2013-07-11 NOTE — Assessment & Plan Note (Signed)
Test today showed generally satisfactory parameters. She is already oncombination statin and fenofibrate treatment. The triglycerides are a little high and the HDL is borderline low. The LDL is within desirable range. More aggressive treatment in this elderly lady with dementia and chronic renal insufficiency does not appear to be justified

## 2013-07-12 ENCOUNTER — Telehealth: Payer: Self-pay | Admitting: *Deleted

## 2013-07-12 DIAGNOSIS — Z79899 Other long term (current) drug therapy: Secondary | ICD-10-CM

## 2013-07-12 NOTE — Telephone Encounter (Signed)
Called patient. She was unable to take instruction  She request for RN to call her son Erica Stanton  [address] ,no answer ,left message to stop  Digoxin ,labs on Jul 19 2013 Request for son to call on Monday 16,2015 - to confirm received message.  Lab order done -BMP

## 2013-07-12 NOTE — Telephone Encounter (Signed)
Message copied by Raiford Simmonds on Fri Jul 12, 2013  5:42 PM ------      Message from: Sanda Klein      Created: Thu Jul 11, 2013 10:40 PM       Stop digoxin. Recheck BMET around Feb 20th. Please copy labs to her Nephrologist (I'm not sure, but might be Dr. Justin Mend). ------

## 2013-07-16 ENCOUNTER — Ambulatory Visit: Payer: Medicare Other | Admitting: Physician Assistant

## 2013-07-19 ENCOUNTER — Ambulatory Visit (INDEPENDENT_AMBULATORY_CARE_PROVIDER_SITE_OTHER): Payer: Medicare Other | Admitting: Physician Assistant

## 2013-07-19 ENCOUNTER — Other Ambulatory Visit (INDEPENDENT_AMBULATORY_CARE_PROVIDER_SITE_OTHER): Payer: Medicare Other

## 2013-07-19 ENCOUNTER — Encounter: Payer: Self-pay | Admitting: Physician Assistant

## 2013-07-19 VITALS — BP 130/70 | HR 66 | Ht 64.0 in | Wt 152.0 lb

## 2013-07-19 DIAGNOSIS — R630 Anorexia: Secondary | ICD-10-CM

## 2013-07-19 DIAGNOSIS — R112 Nausea with vomiting, unspecified: Secondary | ICD-10-CM

## 2013-07-19 LAB — COMPREHENSIVE METABOLIC PANEL
ALK PHOS: 74 U/L (ref 39–117)
ALT: 11 U/L (ref 0–35)
AST: 23 U/L (ref 0–37)
Albumin: 3.5 g/dL (ref 3.5–5.2)
BUN: 23 mg/dL (ref 6–23)
CALCIUM: 8.3 mg/dL — AB (ref 8.4–10.5)
CO2: 24 meq/L (ref 19–32)
Chloride: 114 mEq/L — ABNORMAL HIGH (ref 96–112)
Creatinine, Ser: 3.1 mg/dL — ABNORMAL HIGH (ref 0.4–1.2)
GFR: 15.34 mL/min — AB (ref >60.00)
Glucose, Bld: 99 mg/dL (ref 70–99)
Potassium: 5.8 mEq/L — ABNORMAL HIGH (ref 3.5–5.1)
SODIUM: 145 meq/L (ref 135–145)
Total Bilirubin: 0.4 mg/dL (ref 0.3–1.2)
Total Protein: 6.4 g/dL (ref 6.0–8.3)

## 2013-07-19 LAB — CBC WITH DIFFERENTIAL/PLATELET
Basophils Absolute: 0 10*3/uL (ref 0.0–0.1)
Basophils Relative: 0.3 % (ref 0.0–3.0)
Eosinophils Absolute: 0.2 10*3/uL (ref 0.0–0.7)
Eosinophils Relative: 1.8 % (ref 0.0–5.0)
HCT: 38.8 % (ref 36.0–46.0)
HEMOGLOBIN: 12.4 g/dL (ref 12.0–15.0)
Lymphocytes Relative: 14.4 % (ref 12.0–46.0)
Lymphs Abs: 1.5 10*3/uL (ref 0.7–4.0)
MCHC: 31.9 g/dL (ref 30.0–36.0)
MCV: 89.9 fl (ref 78.0–100.0)
MONOS PCT: 6.6 % (ref 3.0–12.0)
Monocytes Absolute: 0.7 10*3/uL (ref 0.1–1.0)
Neutro Abs: 7.9 10*3/uL — ABNORMAL HIGH (ref 1.4–7.7)
Neutrophils Relative %: 76.9 % (ref 43.0–77.0)
Platelets: 301 10*3/uL (ref 150.0–400.0)
RBC: 4.31 Mil/uL (ref 3.87–5.11)
RDW: 15.2 % — ABNORMAL HIGH (ref 11.5–14.6)
WBC: 10.3 10*3/uL (ref 4.5–10.5)

## 2013-07-19 NOTE — Progress Notes (Signed)
Subjective:    Patient ID: Erica Stanton, female    DOB: 02/19/1928, 80 y.o.   MRN: [id number]  HPI Erica Stanton is a pleasant 80 year old white female known remotely to Dr. Fuller Plan. She had been diagnosed with colon cancer in 1997 and underwent a resection. She is also status post cholecystectomy and repair of ventral hernia. She has had followup colonoscopies the last done in 2009 at which time she was found to have several colon polyps and internal hemorrhoids. Path from the polyps consistent with tubular adenomas. She also had an EGD in 2009 that showed gastritis. She has multiple other medical problems including history of bradycardia for which he status post pacemaker placement, history of aortic stenosis, dementia, history of TIA, and chronic kidney disease which is now stage IV. She is referred by Dr. Luane School cardiology currently for complaints of nausea and lack of appetite. Her son states that she has had symptoms commonly over the past couple of weeks. She had been on digoxin which was discontinued about a week ago after was noticed third her creatinine was on the [place] at that point was around 3. Her son says she's not had any improvement in her symptoms off digoxin. She has been forcing herself to eat and overall her weight has been stable. She had an appointment with Dr. Clover Mealy earlier this week and it is felt that most likely her nausea and anorexia is secondary to uremia. Her most recent creatinine on 07/04/2013 was 3.07 with a BUN of 35, WBC 9.5 hemoglobin 11.9 hematocrit of 36.9. Patient's son relates that they are to go back to Dr. Moshe Cipro  and are supposed to be deciding about proceeding with dialysis. Patient has been using Phenergan for nausea and queasiness with good success. She denies any heartburn or indigestion no dysphagia or about aphasia. She has no complaints of abdominal pain just says that she feels queasy intermittently all day. No change in her bowel habits melena or  hematochezia. Patient and her son seem to be unaware that they were supposed to come back for colonoscopy.  Review of Systems  Constitutional: Positive for appetite change and fatigue.  HENT: Negative.   Eyes: Negative.   Respiratory: Negative.   Gastrointestinal: Negative.   Endocrine: Negative.   Genitourinary: Negative.   Musculoskeletal: Positive for gait problem.  Allergic/Immunologic: Negative.   Neurological: Negative.   Hematological: Negative.   Psychiatric/Behavioral: Negative.    Outpatient Prescriptions Prior to Visit  Medication Sig Dispense Refill  . amitriptyline (ELAVIL) 10 MG tablet Take 10 mg by mouth 2 (two) times daily.      Marland Kitchen amLODipine (NORVASC) 5 MG tablet Take 5 mg by mouth daily.      Marland Kitchen aspirin 81 MG tablet Take 81 mg by mouth daily.      . clopidogrel (PLAVIX) 75 MG tablet Take 75 mg by mouth daily.      Marland Kitchen donepezil (ARICEPT) 10 MG tablet Take 10 mg by mouth at bedtime as needed.      . fenofibrate (TRICOR) 48 MG tablet Take 48 mg by mouth daily.      . simvastatin (ZOCOR) 20 MG tablet Take 20 mg by mouth every evening.      . digoxin (LANOXIN) 0.125 MG tablet Take 0.125 mg by mouth daily.       No facility-administered medications prior to visit.   Allergies  Allergen Reactions  . Penicillins     REACTION: rash   Patient Active Problem List   Diagnosis  Date Noted  . Pacemaker - [place] 2010 07/11/2013  . Hyperlipidemia 07/11/2013  . Aortic stenosis 07/11/2013  . Dementia 07/04/2011  . TIA (transient ischemic attack) 07/02/2011  . CKD (chronic kidney disease) stage 4, GFR 15-29 ml/min 07/02/2011  . GERD 03/27/2008  . GASTRITIS 03/27/2008  . Gastroparesis 03/27/2008  . WEIGHT LOSS 02/26/2008  . Abdominal pain, epigastric 02/26/2008  . ADENOCARCINOMA, COLON, HX OF 02/21/2008   History  Substance Use Topics  . Smoking status: Former Smoker -- 1.50 packs/day for 35 years    Types: Cigarettes  . Smokeless tobacco: Current User    Types:  Snuff     Comment: Tobacco info given 07/19/13  . Alcohol Use: No   family history includes Colon cancer in her brother; Colon polyps in her brother; Kidney disease in her son.     Objective:   Physical Exam  well-developed elderly white female in no acute distress, pleasant somewhat hard of hearing accompanied by her son. Blood pressure 130/70 pulse 66 height 5 foot 4 weight 152. HEENT; nontraumatic normocephalic EOMI PERRLA sclera anicteric Neck; supple no JVD, Cardiovascular; regular rate and rhythm with S1-S2 no murmur or gallop, Pulmonary; clear bilaterally, Abdomen; soft nontender nondistended bowel sounds are active there is no palpable mass or hepatosplenomegaly is midline incisional scar, Rectal ;exam not done, Extremities;no clubbing cyanosis or edema skin warm and dry, Psych ;mood and affect appropriate        Assessment & Plan:   #22  80 year old female with stage IV chronic kidney disease with gradual rise in creatinine.  #2 two-week history of nausea and anorexia-likely secondary to uremia. #3 remote history of colon cancer status post partial colectomy 1997 #4 history of adenomatous colon polyps last colonoscopy 2009 overdue for followup #5 history of bradycardia status post pacemaker placement #6 aortic stenosis #7 dementia #8 history of TIA #9 status post cholecystectomy and repair of ventral hernia  Plan; suspect that her anorexia and nausea are secondary to uremia as she has no other specific symptoms.Burnis Medin repeat labs today She may have been getting dig toxic as well and did has now been stopped over the past week and will not be restarted Continue Phenergan as needed for nausea Continued Exelon 60 mg by mouth daily which she has used for GERD No EGD for now Schedule for upper about an ultrasound Advised the patient and son to followup with Dr. Daisy Lazar. We did discuss possibility of another colonoscopy however her renal issues need to be sorted out  first and she will followup with Dr. Fuller Plan in a couple of months and then depending on her overall status decide regarding repeat colonoscopy  Plan

## 2013-07-19 NOTE — Patient Instructions (Addendum)
Please go to the basement level to have your labs drawn.   We scheduled the Ultrasound test at [facility].  Go to front door of [employer] registration inside front door  To register. Date is Thursday 07-25-2013. Arrive at 8:45 am.  Have nothing to eat or drink past midnight.  Stay on the Dexilant 60 mg - 1 tablet daily. Take Phenergan for nausea as needed.   You have a follow up appointment with Dr. Fuller Plan on 09-26-2013 at 10:45 am.

## 2013-07-19 NOTE — Progress Notes (Signed)
Reviewed and agree with initial management plan. Given her age and co-morbidities I do not recommend a routine follow up colonoscopy. Given her age and co-morbidities we should try to avoid any endoscopic procedure if possible.  Erica Stanton. Fuller Plan, MD [facility]

## 2013-07-22 ENCOUNTER — Telehealth: Payer: Self-pay | Admitting: *Deleted

## 2013-07-22 ENCOUNTER — Other Ambulatory Visit: Payer: Self-pay | Admitting: *Deleted

## 2013-07-22 DIAGNOSIS — R110 Nausea: Secondary | ICD-10-CM

## 2013-07-22 DIAGNOSIS — R634 Abnormal weight loss: Secondary | ICD-10-CM

## 2013-07-22 NOTE — Telephone Encounter (Signed)
Ask son when he brings his mother to [facility] for the Ultrasound on 2-26 at 9 AM, if he could bring her over to our lab afterwards for one more blood test.  He said he could do that.Erica Stanton

## 2013-07-22 NOTE — Telephone Encounter (Signed)
Message copied by Tonette Bihari on Mon Jul 22, 2013  5:05 PM ------      Message from: Hulan Saas      Created: Mon Jul 22, 2013  4:36 PM       Raesha Coonrod,      Looks like this patient was to have a digoxin level drawn. I do not see it.      R ------

## 2013-07-23 ENCOUNTER — Other Ambulatory Visit: Payer: Self-pay | Admitting: *Deleted

## 2013-07-23 DIAGNOSIS — Z01810 Encounter for preprocedural cardiovascular examination: Secondary | ICD-10-CM

## 2013-07-23 DIAGNOSIS — N184 Chronic kidney disease, stage 4 (severe): Secondary | ICD-10-CM

## 2013-07-24 ENCOUNTER — Encounter: Payer: Self-pay | Admitting: Vascular Surgery

## 2013-07-25 ENCOUNTER — Ambulatory Visit (HOSPITAL_COMMUNITY): Admission: RE | Admit: 2013-07-25 | Payer: Medicare Other | Source: Ambulatory Visit

## 2013-07-25 ENCOUNTER — Other Ambulatory Visit (HOSPITAL_COMMUNITY): Payer: Medicare Other

## 2013-07-25 ENCOUNTER — Ambulatory Visit: Payer: Medicare Other | Admitting: Vascular Surgery

## 2013-07-25 ENCOUNTER — Encounter (HOSPITAL_COMMUNITY): Payer: Medicare Other

## 2013-07-31 ENCOUNTER — Telehealth: Payer: Self-pay | Admitting: *Deleted

## 2013-07-31 NOTE — Telephone Encounter (Signed)
Son request a copy of his mother 's medication  List.  He states he is trying to order her medications  Copy of patient's last-- AVS-- 07/04/13 --- LEFT AT [place].

## 2013-09-26 ENCOUNTER — Ambulatory Visit: Payer: Medicare Other | Admitting: Gastroenterology

## 2013-09-26 ENCOUNTER — Encounter: Payer: Self-pay | Admitting: Internal Medicine

## 2013-10-03 LAB — MDC_IDC_ENUM_SESS_TYPE_REMOTE
Battery Remaining Longevity: 71 mo
Battery Voltage: 2.92 V
Brady Statistic RA Percent Paced: 95 %
Date Time Interrogation Session: 20150507071119
Implantable Pulse Generator Serial Number: 7074704
Lead Channel Impedance Value: 800 Ohm
Lead Channel Pacing Threshold Pulse Width: 0.6 ms
Lead Channel Sensing Intrinsic Amplitude: 3.8 mV
Lead Channel Setting Pacing Amplitude: 1.75 V
Lead Channel Setting Pacing Amplitude: 2 V
Lead Channel Setting Pacing Pulse Width: 0.6 ms
MDC IDC MSMT LEADCHNL RA PACING THRESHOLD AMPLITUDE: 0.5 V
MDC IDC MSMT LEADCHNL RA PACING THRESHOLD PULSEWIDTH: 0.5 ms
MDC IDC MSMT LEADCHNL RV IMPEDANCE VALUE: 540 Ohm
MDC IDC MSMT LEADCHNL RV PACING THRESHOLD AMPLITUDE: 1.5 V
MDC IDC MSMT LEADCHNL RV SENSING INTR AMPL: 2.7 mV
MDC IDC SET LEADCHNL RV SENSING SENSITIVITY: 1.5 mV
MDC IDC STAT BRADY AP VP PERCENT: 7.4 %
MDC IDC STAT BRADY AP VS PERCENT: 90 %
MDC IDC STAT BRADY AS VP PERCENT: 1 %
MDC IDC STAT BRADY AS VS PERCENT: 1.8 %
MDC IDC STAT BRADY RV PERCENT PACED: 8.1 %

## 2013-10-04 ENCOUNTER — Ambulatory Visit (INDEPENDENT_AMBULATORY_CARE_PROVIDER_SITE_OTHER): Payer: Medicare Other | Admitting: *Deleted

## 2013-10-04 DIAGNOSIS — R001 Bradycardia, unspecified: Secondary | ICD-10-CM

## 2013-10-04 DIAGNOSIS — I498 Other specified cardiac arrhythmias: Secondary | ICD-10-CM

## 2013-10-18 NOTE — Progress Notes (Signed)
Remote pacemaker transmission.   

## 2013-11-01 ENCOUNTER — Encounter: Payer: Self-pay | Admitting: Cardiology

## 2013-12-20 IMAGING — CT CT HEAD W/O CM
1 of 2 series · 13 of 30 positions shown, 17 images · non-contrast
Comparison: CT head without contrast 07/02/2011.

CLINICAL DATA: Question CVA.  Code stroke yesterday.  24 are follow-
up.

CT HEAD WITHOUT CONTRAST
TECHNIQUE: Contiguous axial images were obtained from the base of
the skull through the vertex without contrast.

[Series 2: brain · axial · 0.49mm/px · z∈[+132,+262]mm · 13 of 28 slices shown, 17 images]
[im 2/28  brain]
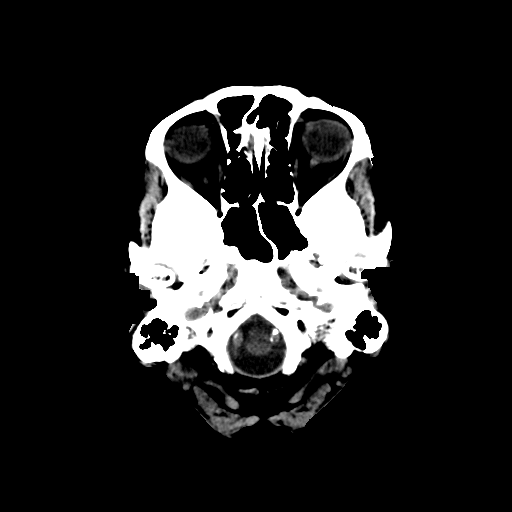
[im 2/28  bone]
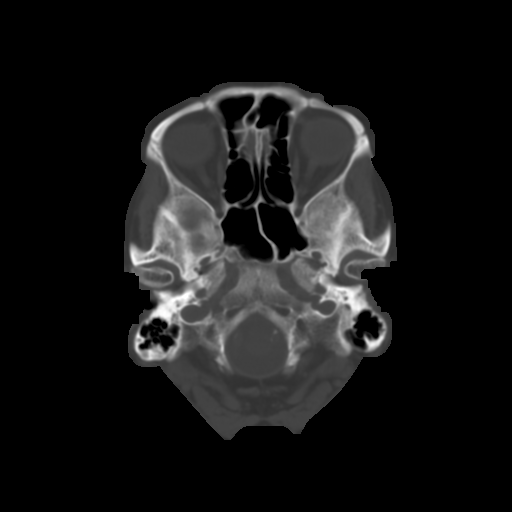
[im 4/28  brain]
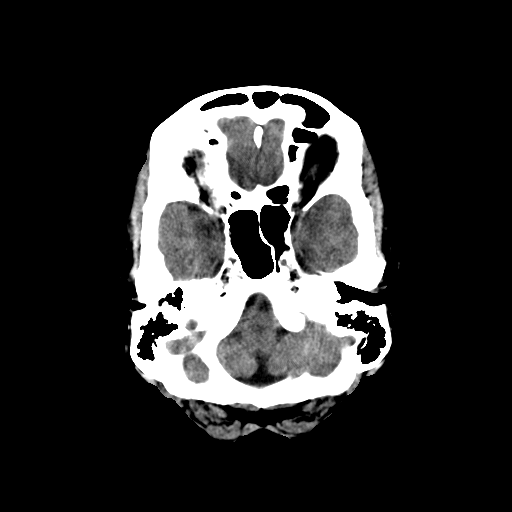
[im 6/28  brain]
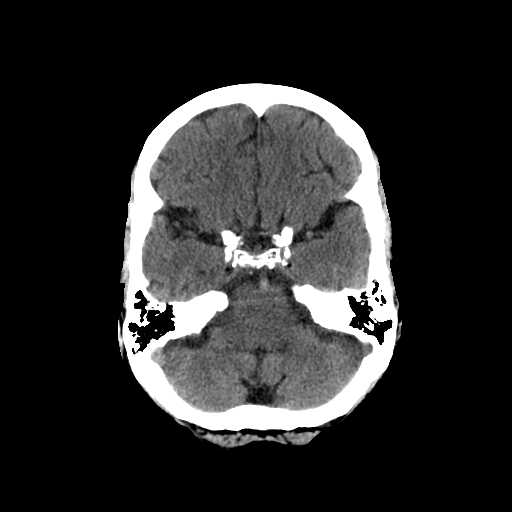
[im 8/28  brain]
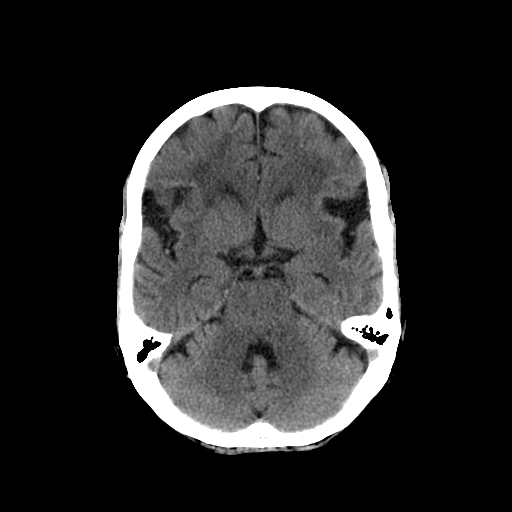
[im 10/28  brain]
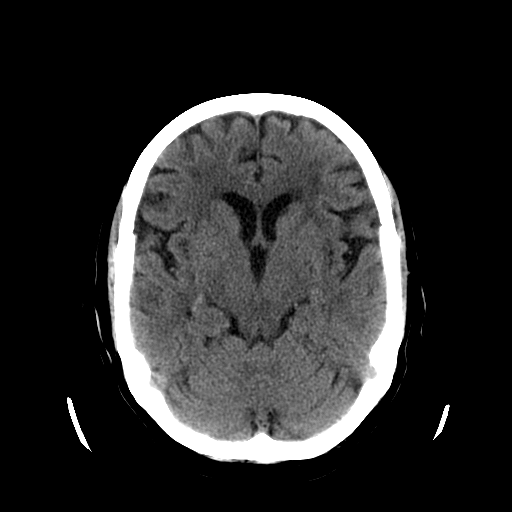
[im 10/28  bone]
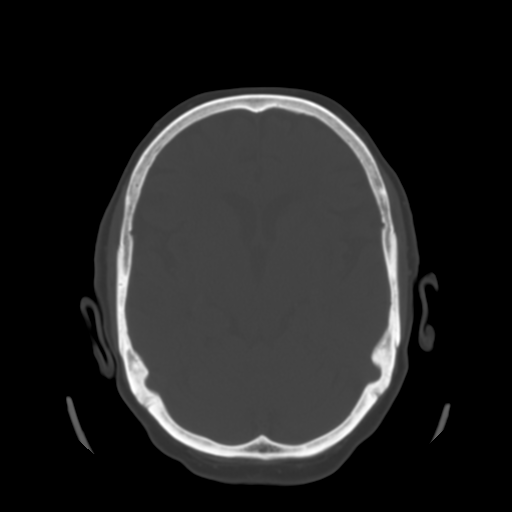
[im 12/28  brain]
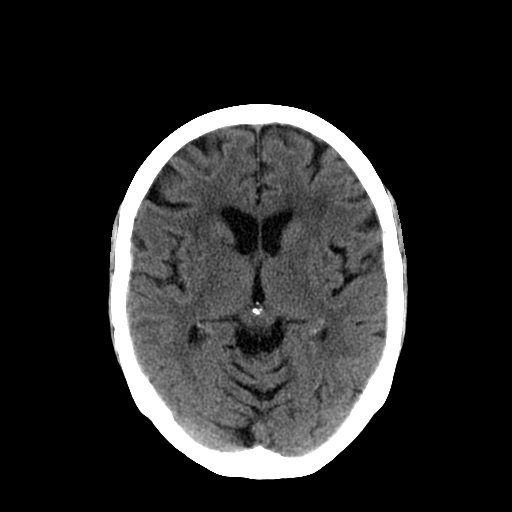
[im 14/28  brain]
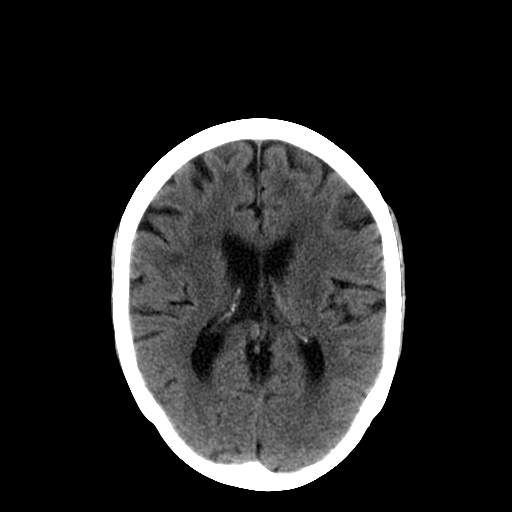
[im 16/28  brain]
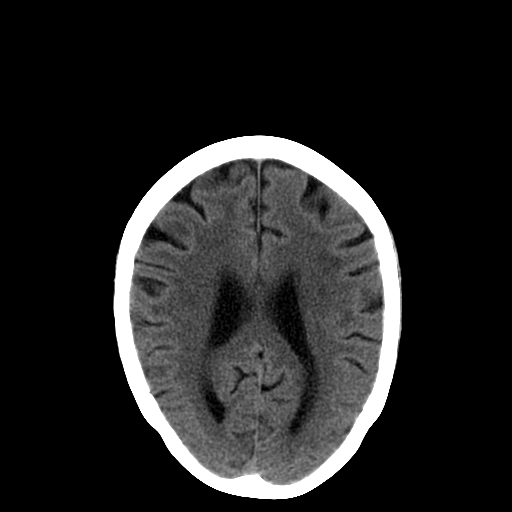
[im 18/28  brain]
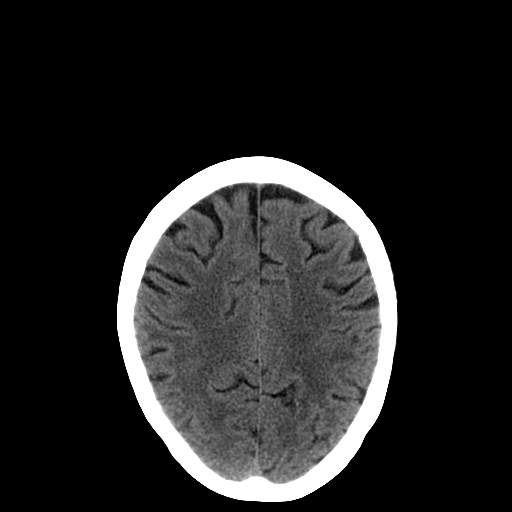
[im 18/28  bone]
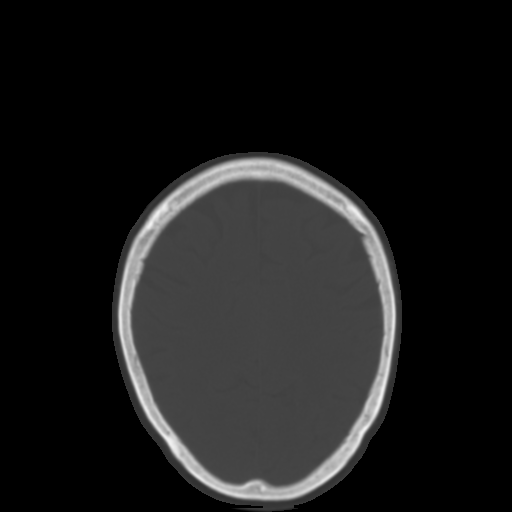
[im 20/28  brain]
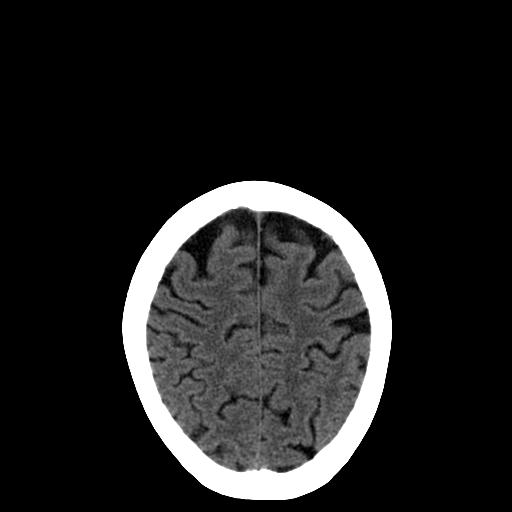
[im 22/28  brain]
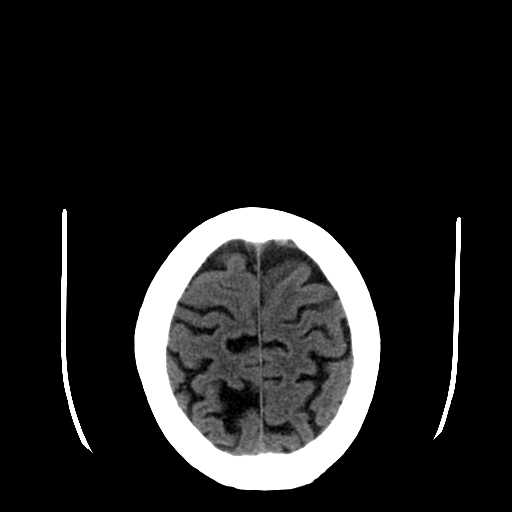
[im 24/28  brain]
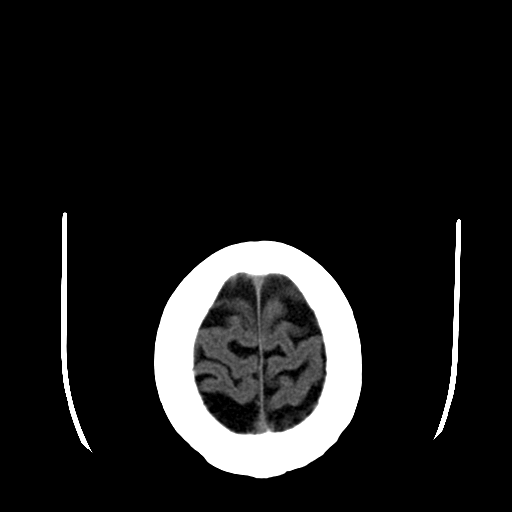
[im 26/28  brain]
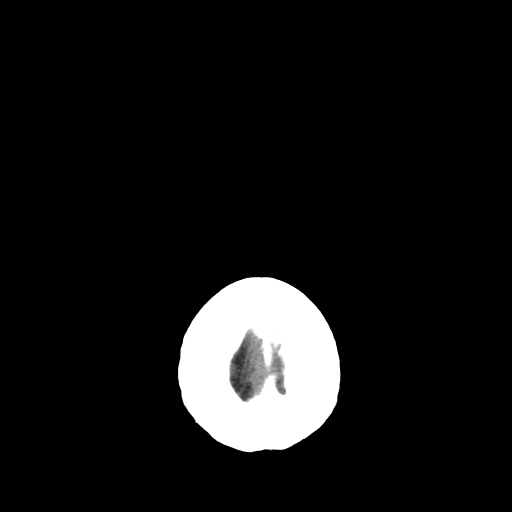
[im 26/28  bone]
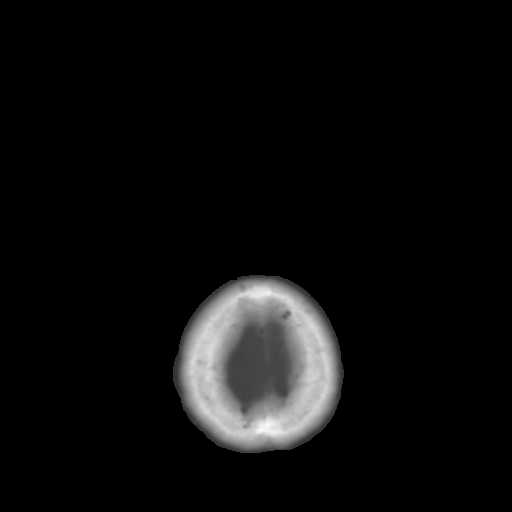

[13 of 30 positions shown; findings below may reference images not displayed]

FINDINGS: Extensive periventricular and subcortical white matter
hypoattenuation is stable.  Remote lacunar infarcts of the basal
ganglia are stable bilaterally.  No acute cortical infarct,
hemorrhage, or mass lesion is present.  Generalized atrophy is
similar to the prior exam.  There is a remote infarct of the right
pons.

The paranasal sinuses and mastoid air cells are clear.  The osseous
skull is intact.  Atherosclerotic calcifications are present within
the cavernous carotid arteries bilaterally.
IMPRESSION: 1.  Stable atrophy and diffuse white matter disease.  This likely
reflects the sequelae of chronic microvascular ischemia.
2.  Remote lacunar infarcts of the basal ganglia and brainstem.
3.  No acute intracranial abnormality or significant interval
change.

## 2014-01-06 ENCOUNTER — Encounter: Payer: Self-pay | Admitting: Gastroenterology

## 2014-01-17 ENCOUNTER — Encounter: Payer: Self-pay | Admitting: Cardiovascular Disease

## 2014-01-17 ENCOUNTER — Ambulatory Visit (INDEPENDENT_AMBULATORY_CARE_PROVIDER_SITE_OTHER): Payer: Medicare Other | Admitting: Cardiovascular Disease

## 2014-01-17 ENCOUNTER — Ambulatory Visit: Payer: Medicare Other | Admitting: Cardiovascular Disease

## 2014-01-17 VITALS — BP 137/80 | HR 70 | Resp 16 | Ht 65.0 in | Wt 162.0 lb

## 2014-01-17 DIAGNOSIS — I498 Other specified cardiac arrhythmias: Secondary | ICD-10-CM

## 2014-01-17 DIAGNOSIS — N184 Chronic kidney disease, stage 4 (severe): Secondary | ICD-10-CM

## 2014-01-17 DIAGNOSIS — R001 Bradycardia, unspecified: Secondary | ICD-10-CM

## 2014-01-17 DIAGNOSIS — G458 Other transient cerebral ischemic attacks and related syndromes: Secondary | ICD-10-CM

## 2014-01-17 DIAGNOSIS — I4891 Unspecified atrial fibrillation: Secondary | ICD-10-CM

## 2014-01-17 DIAGNOSIS — Z950 Presence of cardiac pacemaker: Secondary | ICD-10-CM

## 2014-01-17 DIAGNOSIS — I350 Nonrheumatic aortic (valve) stenosis: Secondary | ICD-10-CM

## 2014-01-17 DIAGNOSIS — I480 Paroxysmal atrial fibrillation: Secondary | ICD-10-CM

## 2014-01-17 DIAGNOSIS — E785 Hyperlipidemia, unspecified: Secondary | ICD-10-CM

## 2014-01-17 DIAGNOSIS — I359 Nonrheumatic aortic valve disorder, unspecified: Secondary | ICD-10-CM

## 2014-01-17 LAB — MDC_IDC_ENUM_SESS_TYPE_INCLINIC
Brady Statistic RA Percent Paced: 95 %
Implantable Pulse Generator Model: 2210
Implantable Pulse Generator Serial Number: 7074704
Lead Channel Impedance Value: 475 Ohm
Lead Channel Pacing Threshold Amplitude: 1 V
Lead Channel Pacing Threshold Pulse Width: 0.5 ms
Lead Channel Pacing Threshold Pulse Width: 0.6 ms
Lead Channel Sensing Intrinsic Amplitude: 3.1 mV
Lead Channel Setting Pacing Amplitude: 2 V
Lead Channel Setting Pacing Pulse Width: 0.6 ms
Lead Channel Setting Sensing Sensitivity: 1 mV
MDC IDC MSMT BATTERY REMAINING LONGEVITY: 85.2 mo
MDC IDC MSMT BATTERY VOLTAGE: 2.92 V
MDC IDC MSMT LEADCHNL RA IMPEDANCE VALUE: 812.5 Ohm
MDC IDC MSMT LEADCHNL RA PACING THRESHOLD AMPLITUDE: 0.5 V
MDC IDC MSMT LEADCHNL RA PACING THRESHOLD AMPLITUDE: 0.5 V
MDC IDC MSMT LEADCHNL RA PACING THRESHOLD PULSEWIDTH: 0.5 ms
MDC IDC MSMT LEADCHNL RA SENSING INTR AMPL: 3.1 mV
MDC IDC SESS DTM: 20150821100449
MDC IDC SET LEADCHNL RV PACING AMPLITUDE: 1.25 V
MDC IDC STAT BRADY RV PERCENT PACED: 17 %

## 2014-01-17 LAB — PACEMAKER DEVICE OBSERVATION

## 2014-01-17 NOTE — Progress Notes (Signed)
Patient ID: Erica Stanton, female   DOB: 06/12/1927, 80 y.o.   MRN: [id number]      Reason for office visit Pacemaker, aortic stenosis  Erica Stanton has not had major events since her last appointment earlier this year. She has worsening pedal edema and tells me that she was started on a diuretic by Dr. Moshe Cipro. She does not know the name or dose of this medication. She tells me that she has blood work done monthly in the nephrology office. She thinks she had cholesterol checked earlier this year with Dr. Karlton Lemon. She denies dyspnea, angina or syncope.  Her pacemaker leads are almost 78 years old and there has been a gradual deterioration in ventricular sensing. Measured R waves are now is only around 3 mV, with automatic sensing measurements that are sometimes less than 2 mV. There is 95% atrial pacing and only 17% ventricular pacing. The device shows occasional episodes of atrial tachycardia and atrial fibrillation. The longest episode lasted for only 21 minutes. The overall burden of arrhythmia is well under 1%. There seems to be a general pattern of increasing frequency of the atrial tachyarrhythmia over the last year, but the burden remains very very low. She is unaware of palpitations. He does not have a history of stroke has had a previous transient ischemic attack. She has not had bleeding problems on treatment with aspirin and clopidogrel.  She received a dual-chamber permanent pacemaker in 2002 for symptomatic bradycardia and a new generator was changed out in 2010. She had syncope before pacemaker implantation and this has not occurred since she is not pacemaker dependent but does take atrium 90% of the time. For the most part she has intact native AV conduction.  Her device is a Buyer, retail DR RF device.  Her device has recorded occasional very brief episodes of paroxysmal atrial fibrillation. Significant comorbid conditions include mild valvular aortic stenosis, hyperlipidemia,  HTN and a history of TIA in November of 2013. She has moderate to severe chronic kidney disease with a baseline creatinine clearance of around 20 mL per minute (Dr. Moshe Cipro). Does not have a known coronary artery disease and had a normal nuclear stress test in 2010. She has normal left ventricular systolic function with echo evidence of grade 2 diastolic dysfunction. Estimated aortic valve area was around 1.3 cm square echo 2013. She has had several surgeries including colon cancer resected in 2001, previous cholecystectomy, previous ventral hernia repair, back surgery.  She has early dementia and lives with her son and daughter-in-law. She quit smoking 20 years ago but has a roughly 50 pack year history before that.   Allergies  Allergen Reactions  . Penicillins     REACTION: rash    Current Outpatient Prescriptions  Medication Sig Dispense Refill  . amitriptyline (ELAVIL) 10 MG tablet Take 10 mg by mouth 2 (two) times daily.      Marland Kitchen amLODipine (NORVASC) 5 MG tablet Take 5 mg by mouth daily.      Marland Kitchen aspirin 81 MG tablet Take 81 mg by mouth daily.      . clopidogrel (PLAVIX) 75 MG tablet Take 75 mg by mouth daily.      Marland Kitchen donepezil (ARICEPT) 10 MG tablet Take 10 mg by mouth at bedtime as needed.      . fenofibrate (TRICOR) 48 MG tablet Take 48 mg by mouth daily.      . promethazine (PHENERGAN) 12.5 MG tablet Take 12.5 mg by mouth every 8 (eight) hours as  needed for nausea or vomiting.      . saccharomyces boulardii (FLORASTOR) 250 MG capsule Take 250 mg by mouth daily.      . simvastatin (ZOCOR) 20 MG tablet Take 20 mg by mouth every evening.       No current facility-administered medications for this visit.    Past Medical History  Diagnosis Date  . Colon cancer   . Hx of hernia repair   . Dementia   . Pacemaker 04/16/2009    St.Jude  . Hypertension   . Hyperlipemia   . CKD (chronic kidney disease) stage 4, GFR 15-29 ml/min 07/02/2011  . TIA (transient ischemic attack) 07/02/2011    . Aortic stenosis   . Symptomatic bradycardia   . Hyperlipidemia 07/11/2013  . Colon polyps     Past Surgical History  Procedure Laterality Date  . Abdominal hysterectomy    . Permanent pacemaker insertion  04/16/2009    St. Jude  . Colon resection    . Cholecystectomy  2007  . Ventral hernia repair  2008  . Back surgery  03/2009    multiple times    Family History  Problem Relation Age of Onset  . Kidney disease Son   . Colon cancer Brother     x 2  . Colon polyps Brother     History   Social History  . Marital Status: Widowed    Spouse Name: N/A    Number of Children: 4  . Years of Education: N/A   Occupational History  . retired    Social History Main Topics  . Smoking status: Former Smoker -- 1.50 packs/day for 35 years    Types: Cigarettes  . Smokeless tobacco: Current User    Types: Snuff     Comment: Tobacco info given 07/19/13  . Alcohol Use: No  . Drug Use: No  . Sexual Activity: Not on file   Other Topics Concern  . Not on file   Social History Narrative  . No narrative on file    Review of systems: She is very hard of hearing. She has had worsening pedal edema but she says it resolves with overnight supine position. The patient specifically denies any chest pain at rest or with exertion, dyspnea at rest or with exertion, orthopnea, paroxysmal nocturnal dyspnea, syncope, palpitations, focal neurological deficits, intermittent claudication, unexplained weight gain, cough, hemoptysis or wheezing.  The patient also denies abdominal pain, nausea, vomiting, dysphagia, diarrhea, constipation, polyuria, polydipsia, dysuria, hematuria, frequency, urgency, abnormal bleeding or bruising, fever, chills, unexpected weight changes, mood swings, change in skin or hair texture, change in voice quality, auditory or visual problems, allergic reactions or rashes, new musculoskeletal complaints other than usual "aches and pains".   PHYSICAL EXAM BP 137/80  Pulse  70  Resp 16  Ht 5\' 5"  (1.651 m)  Wt 162 lb (73.483 kg)  BMI 26.96 kg/m2 General: Alert, oriented x3, no distress, kyphosis  Head: no evidence of trauma, PERRL, EOMI, no exophtalmos or lid lag, no myxedema, no xanthelasma; normal ears, nose and oropharynx  Neck: normal jugular venous pulsations and no hepatojugular reflux; brisk carotid pulses without delay and no carotid bruits  Chest: clear to auscultation, no signs of consolidation by percussion or palpation, normal fremitus, symmetrical and full respiratory excursions, healthy-appearing right subclavian pacemaker site  Cardiovascular: normal position and quality of the apical impulse, regular rhythm, normal first and second heart sounds, no Rubs, or gallops, grade 2/6 early peaking systolic ejection murmur heard at both  the right and left upper sternal border radiating towards the carotids  Abdomen: no tenderness or distention, no masses by palpation, no abnormal pulsatility or arterial bruits, normal bowel sounds, no hepatosplenomegaly  Extremities: no clubbing, cyanosis; 3+ symmetrical pedaledema; 2+ radial, ulnar and brachial pulses bilaterally; 2+ right femoral, posterior tibial and dorsalis pedis pulses; 2+ left femoral, posterior tibial and dorsalis pedis pulses; no subclavian or femoral bruits  Neurological: grossly nonfocal   EKG: Atrial paced, ventricular sensed, otherwise normal  Lipid Panel     Component Value Date/Time   CHOL 160 07/04/2013 1026   TRIG 211* 07/04/2013 1026   HDL 40 07/04/2013 1026   CHOLHDL 4.0 07/04/2013 1026   VLDL 42* 07/04/2013 1026   LDLCALC 78 07/04/2013 1026    BMET    Component Value Date/Time   NA 145 07/19/2013 1129   K 5.8* 07/19/2013 1129   CL 114* 07/19/2013 1129   CO2 24 07/19/2013 1129   GLUCOSE 99 07/19/2013 1129   BUN 23 07/19/2013 1129   CREATININE 3.1* 07/19/2013 1129   CREATININE 3.07* 07/04/2013 1026   CALCIUM 8.3* 07/19/2013 1129   GFRNONAA 20* 07/04/2011 0500   GFRAA 23* 07/04/2011 0500      ASSESSMENT AND PLAN Paroxysmal atrial fibrillation She does have a previous history of transient ischemic attack, is elderly and has systemic hypertension, placing her in a relatively high risk category for future embolic stroke. On the other hand she is elderly and has substantial renal insufficiency placing her at increased risk for bleeding. At this point the risk-benefit ratio of full dose anticoagulation is questionable. We'll continue to monitor but may have to discuss treatment with warfarin or eliquis in the future. CHADSVasc 5, HAS-BLED 5 (warfarin "chance of benefit" 1 in 15, "chance of harm" 1 in 13).  Pacemaker - St. Jude 2010  Normal device function, deteriorating ventricular lead parameters. Expected device longevity another 6-7 years. There is virtually 100% atrial pacing but only 17% ventricular pacing. Ventricular lead sensitivity settings were adjusted for the poor R waves. If possible would like to continue working with the current lead until she is due for generator in about 6-7 years. Performance remote monitoring. So far the ventricular lead problems have not interfered with device function. Hyperlipidemia  On combination statin and fenofibrate treatment. The triglycerides are a little high and the HDL is borderline low. The LDL is within desirable range. More aggressive treatment in this elderly lady with dementia and chronic renal insufficiency does not appear to be justified  CKD (chronic kidney disease) stage 4, GFR 15-29 ml/min  She might benefit from an increased dose of diuretics, but I'm loath to make changes since I don't know what she is artery taking and don't have her most recent lab work Aortic stenosis  Severe degenerative changes and calcification noted by echo but the estimated aortic valve area is around 1.3 cm square and the mean gradient is only 16 mm Hg. Not yet hemodynamically important  Orders Placed This Encounter  Procedures  . EKG 12-Lead    Patient Instructions  Remote monitoring is used to monitor your pacemaker from home. This monitoring reduces the number of office visits required to check your device to one time per year. It allows [place] to keep an eye on the functioning of your device to ensure it is working properly. You are scheduled for a device check from home on 04-21-2014. You may send your transmission at any time that day. If you have a wireless  device, the transmission will be sent automatically. After your physician reviews your transmission, you will receive a postcard with your next transmission date.  Your physician recommends that you schedule a follow-up appointment in: 12 months with Dr.Merrie Epler + pacemaker check       Norbert Malkin  Sanda Klein, MD, [place] [phone] office [phone] pager

## 2014-01-17 NOTE — Assessment & Plan Note (Addendum)
She does have a previous history of transient ischemic attack, is elderly and has systemic hypertension, placing her in a relatively high risk category for future embolic stroke. On the other hand she is elderly and has substantial renal insufficiency placing her at increased risk for bleeding. At this point the risk-benefit ratio of full dose anticoagulation is questionable. We'll continue to monitor but may have to discuss treatment with warfarin or eliquis in the future. CHADSVasc 5, HAS-BLED 5 (warfarin "chance of benefit" 1 in 15, "chance of harm" 1 in 13).

## 2014-01-17 NOTE — Patient Instructions (Addendum)
Remote monitoring is used to monitor your pacemaker from home. This monitoring reduces the number of office visits required to check your device to one time per year. It allows [place] to keep an eye on the functioning of your device to ensure it is working properly. You are scheduled for a device check from home on 04-21-2014. You may send your transmission at any time that day. If you have a wireless device, the transmission will be sent automatically. After your physician reviews your transmission, you will receive a postcard with your next transmission date.  Your physician recommends that you schedule a follow-up appointment in: 12 months with Dr.Croitoru + pacemaker check

## 2014-01-22 ENCOUNTER — Encounter: Payer: Self-pay | Admitting: Cardiovascular Disease

## 2014-04-21 ENCOUNTER — Ambulatory Visit (INDEPENDENT_AMBULATORY_CARE_PROVIDER_SITE_OTHER): Payer: Medicare Other | Admitting: *Deleted

## 2014-04-21 DIAGNOSIS — R001 Bradycardia, unspecified: Secondary | ICD-10-CM

## 2014-04-21 NOTE — Progress Notes (Signed)
Remote pacemaker transmission.   

## 2014-04-23 LAB — MDC_IDC_ENUM_SESS_TYPE_REMOTE
Battery Remaining Longevity: 82 mo
Battery Remaining Percentage: 67 %
Brady Statistic AP VS Percent: 95 %
Brady Statistic AS VS Percent: 1.6 %
Date Time Interrogation Session: 20151123082030
Implantable Pulse Generator Model: 2210
Lead Channel Impedance Value: 810 Ohm
Lead Channel Pacing Threshold Amplitude: 1 V
Lead Channel Pacing Threshold Pulse Width: 0.5 ms
Lead Channel Pacing Threshold Pulse Width: 0.6 ms
Lead Channel Sensing Intrinsic Amplitude: 2.3 mV
Lead Channel Sensing Intrinsic Amplitude: 4.3 mV
Lead Channel Setting Pacing Amplitude: 1.25 V
Lead Channel Setting Pacing Amplitude: 2 V
Lead Channel Setting Pacing Pulse Width: 0.6 ms
MDC IDC MSMT BATTERY VOLTAGE: 2.93 V
MDC IDC MSMT LEADCHNL RA PACING THRESHOLD AMPLITUDE: 0.5 V
MDC IDC MSMT LEADCHNL RV IMPEDANCE VALUE: 600 Ohm
MDC IDC PG SERIAL: 7074704
MDC IDC SET LEADCHNL RV SENSING SENSITIVITY: 1 mV
MDC IDC STAT BRADY AP VP PERCENT: 2.7 %
MDC IDC STAT BRADY AS VP PERCENT: 1 %
MDC IDC STAT BRADY RA PERCENT PACED: 95 %
MDC IDC STAT BRADY RV PERCENT PACED: 3 %

## 2014-05-09 ENCOUNTER — Encounter: Payer: Self-pay | Admitting: Cardiovascular Disease

## 2014-07-23 ENCOUNTER — Ambulatory Visit (INDEPENDENT_AMBULATORY_CARE_PROVIDER_SITE_OTHER): Payer: Medicare Other | Admitting: *Deleted

## 2014-07-23 DIAGNOSIS — R001 Bradycardia, unspecified: Secondary | ICD-10-CM

## 2014-07-23 LAB — MDC_IDC_ENUM_SESS_TYPE_REMOTE
Battery Remaining Longevity: 82 mo
Battery Remaining Percentage: 67 %
Battery Voltage: 2.93 V
Brady Statistic AP VP Percent: 2.6 %
Brady Statistic AP VS Percent: 95 %
Brady Statistic AS VS Percent: 1.5 %
Brady Statistic RA Percent Paced: 95 %
Brady Statistic RV Percent Paced: 2.9 %
Date Time Interrogation Session: 20160224125138
Implantable Pulse Generator Serial Number: 7074704
Lead Channel Impedance Value: 810 Ohm
Lead Channel Pacing Threshold Amplitude: 0.5 V
Lead Channel Pacing Threshold Amplitude: 1 V
Lead Channel Pacing Threshold Pulse Width: 0.5 ms
Lead Channel Sensing Intrinsic Amplitude: 1.3 mV
Lead Channel Sensing Intrinsic Amplitude: 3.9 mV
Lead Channel Setting Pacing Amplitude: 2 V
Lead Channel Setting Pacing Pulse Width: 0.6 ms
MDC IDC MSMT LEADCHNL RV IMPEDANCE VALUE: 560 Ohm
MDC IDC MSMT LEADCHNL RV PACING THRESHOLD PULSEWIDTH: 0.6 ms
MDC IDC SET LEADCHNL RV PACING AMPLITUDE: 1.25 V
MDC IDC SET LEADCHNL RV SENSING SENSITIVITY: 1 mV
MDC IDC STAT BRADY AS VP PERCENT: 1 %

## 2014-07-23 NOTE — Progress Notes (Signed)
Remote pacemaker transmission.   

## 2014-08-01 ENCOUNTER — Encounter: Payer: Self-pay | Admitting: Cardiology

## 2014-08-06 ENCOUNTER — Encounter: Payer: Self-pay | Admitting: Cardiovascular Disease

## 2014-08-29 DEATH — deceased

## 2014-10-09 ENCOUNTER — Telehealth: Payer: Self-pay | Admitting: Cardiovascular Disease

## 2014-10-09 NOTE — Telephone Encounter (Signed)
Pt passed away on 07-31-14,he just wanted you to know that.Erica Stanton

## 2014-10-23 ENCOUNTER — Encounter: Payer: Medicaid Other | Admitting: *Deleted

## 2014-10-23 ENCOUNTER — Telehealth: Payer: Self-pay | Admitting: Cardiology

## 2014-10-23 NOTE — Telephone Encounter (Signed)
Attempted to confirm remote transmission with pt. No answer and was unable to leave a message.   

## 2014-10-24 ENCOUNTER — Encounter: Payer: Self-pay | Admitting: Cardiology
